# Patient Record
Sex: Female | Born: 1980 | Hispanic: No | Marital: Single | Smoking: Current some day smoker
Health system: Southern US, Community
[De-identification: ages and names within clinical notes are randomized; demographics above are authoritative.]

## PROBLEM LIST (undated history)

## (undated) DIAGNOSIS — F988 Other specified behavioral and emotional disorders with onset usually occurring in childhood and adolescence: Secondary | ICD-10-CM

## (undated) DIAGNOSIS — E785 Hyperlipidemia, unspecified: Secondary | ICD-10-CM

## (undated) DIAGNOSIS — E119 Type 2 diabetes mellitus without complications: Secondary | ICD-10-CM

## (undated) DIAGNOSIS — R51 Headache: Secondary | ICD-10-CM

## (undated) DIAGNOSIS — F419 Anxiety disorder, unspecified: Secondary | ICD-10-CM

## (undated) DIAGNOSIS — R519 Headache, unspecified: Secondary | ICD-10-CM

## (undated) DIAGNOSIS — I10 Essential (primary) hypertension: Secondary | ICD-10-CM

## (undated) HISTORY — DX: Headache, unspecified: R51.9

## (undated) HISTORY — DX: Headache: R51

## (undated) HISTORY — DX: Other specified behavioral and emotional disorders with onset usually occurring in childhood and adolescence: F98.8

## (undated) HISTORY — DX: Type 2 diabetes mellitus without complications: E11.9

## (undated) HISTORY — DX: Anxiety disorder, unspecified: F41.9

## (undated) HISTORY — DX: Essential (primary) hypertension: I10

## (undated) HISTORY — DX: Hyperlipidemia, unspecified: E78.5

---

## 2007-01-05 ENCOUNTER — Emergency Department (HOSPITAL_COMMUNITY): Admission: EM | Admit: 2007-01-05 | Discharge: 2007-01-06 | Payer: Self-pay | Admitting: Emergency Medicine

## 2009-07-20 ENCOUNTER — Ambulatory Visit: Payer: Self-pay | Admitting: Unknown Physician Specialty

## 2010-10-29 ENCOUNTER — Emergency Department (HOSPITAL_COMMUNITY): Payer: BC Managed Care – PPO

## 2010-10-29 ENCOUNTER — Emergency Department (HOSPITAL_COMMUNITY)
Admission: EM | Admit: 2010-10-29 | Discharge: 2010-10-29 | Disposition: A | Discharge status: Home / Self Care | Payer: BC Managed Care – PPO | Attending: Emergency Medicine | Admitting: Emergency Medicine

## 2010-10-29 DIAGNOSIS — R109 Unspecified abdominal pain: Secondary | ICD-10-CM | POA: Insufficient documentation

## 2010-10-29 DIAGNOSIS — E119 Type 2 diabetes mellitus without complications: Secondary | ICD-10-CM | POA: Insufficient documentation

## 2010-10-29 DIAGNOSIS — N12 Tubulo-interstitial nephritis, not specified as acute or chronic: Secondary | ICD-10-CM | POA: Insufficient documentation

## 2010-10-29 DIAGNOSIS — N72 Inflammatory disease of cervix uteri: Secondary | ICD-10-CM | POA: Insufficient documentation

## 2010-10-29 LAB — WET PREP, GENITAL
Trich, Wet Prep: NONE SEEN
Yeast Wet Prep HPF POC: NONE SEEN

## 2010-10-29 LAB — URINALYSIS, ROUTINE W REFLEX MICROSCOPIC
Bilirubin Urine: NEGATIVE
Protein, ur: NEGATIVE mg/dL
Urobilinogen, UA: 0.2 mg/dL (ref 0.0–1.0)

## 2010-10-29 LAB — DIFFERENTIAL
Basophils Absolute: 0 10*3/uL (ref 0.0–0.1)
Basophils Relative: 0 % (ref 0–1)
Monocytes Relative: 7 % (ref 3–12)
Neutro Abs: 8.3 10*3/uL — ABNORMAL HIGH (ref 1.7–7.7)
Neutrophils Relative %: 62 % (ref 43–77)

## 2010-10-29 LAB — COMPREHENSIVE METABOLIC PANEL
ALT: 15 U/L (ref 0–35)
Alkaline Phosphatase: 105 U/L (ref 39–117)
CO2: 27 mEq/L (ref 19–32)
Chloride: 99 mEq/L (ref 96–112)
GFR calc Af Amer: >60 mL/min (ref >60)
GFR calc non Af Amer: >60 mL/min (ref >60)
Glucose, Bld: 86 mg/dL (ref 70–99)
Potassium: 4.1 mEq/L (ref 3.5–5.1)
Sodium: 136 mEq/L (ref 135–145)
Total Bilirubin: 0.4 mg/dL (ref 0.3–1.2)

## 2010-10-29 LAB — CBC
Hemoglobin: 11.6 g/dL — ABNORMAL LOW (ref 12.0–15.0)
MCH: 24.5 pg — ABNORMAL LOW (ref 26.0–34.0)
RBC: 4.73 MIL/uL (ref 3.87–5.11)

## 2010-10-29 MED ORDER — IOHEXOL 300 MG/ML  SOLN
100.0000 mL | Freq: Once | INTRAMUSCULAR | Status: AC | PRN
  Administered 2010-10-29: 100 mL via INTRAVENOUS

## 2010-10-30 LAB — GC/CHLAMYDIA PROBE AMP, GENITAL
Chlamydia, DNA Probe: POSITIVE — AB
GC Probe Amp, Genital: NEGATIVE

## 2010-10-31 LAB — URINE CULTURE: Colony Count: NO GROWTH

## 2011-02-15 LAB — RAPID STREP SCREEN (MED CTR MEBANE ONLY): Streptococcus, Group A Screen (Direct): NEGATIVE

## 2015-09-05 ENCOUNTER — Emergency Department (HOSPITAL_BASED_OUTPATIENT_CLINIC_OR_DEPARTMENT_OTHER)
Admission: EM | Admit: 2015-09-05 | Discharge: 2015-09-05 | Disposition: A | Discharge status: Home / Self Care | Payer: No Typology Code available for payment source | Attending: Emergency Medicine | Admitting: Emergency Medicine

## 2015-09-05 ENCOUNTER — Encounter (HOSPITAL_BASED_OUTPATIENT_CLINIC_OR_DEPARTMENT_OTHER): Payer: Self-pay

## 2015-09-05 DIAGNOSIS — S060X0A Concussion without loss of consciousness, initial encounter: Secondary | ICD-10-CM | POA: Insufficient documentation

## 2015-09-05 DIAGNOSIS — Y939 Activity, unspecified: Secondary | ICD-10-CM | POA: Insufficient documentation

## 2015-09-05 DIAGNOSIS — R112 Nausea with vomiting, unspecified: Secondary | ICD-10-CM | POA: Insufficient documentation

## 2015-09-05 DIAGNOSIS — Y999 Unspecified external cause status: Secondary | ICD-10-CM | POA: Diagnosis not present

## 2015-09-05 DIAGNOSIS — Y929 Unspecified place or not applicable: Secondary | ICD-10-CM | POA: Insufficient documentation

## 2015-09-05 DIAGNOSIS — S199XXA Unspecified injury of neck, initial encounter: Secondary | ICD-10-CM | POA: Diagnosis present

## 2015-09-05 DIAGNOSIS — F17200 Nicotine dependence, unspecified, uncomplicated: Secondary | ICD-10-CM | POA: Insufficient documentation

## 2015-09-05 MED ORDER — ONDANSETRON 4 MG PO TBDP: 4.0000 mg | ORAL_TABLET | Freq: Three times a day (TID) | ORAL | Status: DC | PRN

## 2015-09-05 MED ORDER — NAPROXEN 500 MG PO TABS: 500.0000 mg | ORAL_TABLET | Freq: Two times a day (BID) | ORAL | Status: DC

## 2015-09-05 MED ORDER — METHOCARBAMOL 500 MG PO TABS: 500.0000 mg | ORAL_TABLET | Freq: Two times a day (BID) | ORAL | Status: DC

## 2015-09-05 NOTE — ED Notes (Signed)
PA at bedside.
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
## 2015-11-14 ENCOUNTER — Institutional Professional Consult (permissible substitution): Payer: Self-pay | Admitting: Neurology

## 2015-12-05 ENCOUNTER — Institutional Professional Consult (permissible substitution): Payer: Self-pay | Admitting: Neurology

## 2015-12-05 ENCOUNTER — Telehealth: Payer: Self-pay

## 2015-12-05 NOTE — Telephone Encounter (Signed)
Pt did not show for their appt with Dr. Dohmeier today.  

## 2015-12-06 ENCOUNTER — Encounter: Payer: Self-pay | Admitting: Neurology

## 2016-01-01 ENCOUNTER — Encounter: Payer: Self-pay | Admitting: Neurology

## 2016-01-01 ENCOUNTER — Ambulatory Visit (INDEPENDENT_AMBULATORY_CARE_PROVIDER_SITE_OTHER): Payer: BLUE CROSS/BLUE SHIELD | Admitting: Neurology

## 2016-01-01 VITALS — BP 140/88 | HR 92 | Resp 20 | Ht 62.0 in | Wt 209.0 lb

## 2016-01-01 DIAGNOSIS — R0683 Snoring: Secondary | ICD-10-CM | POA: Diagnosis not present

## 2016-01-01 DIAGNOSIS — G4710 Hypersomnia, unspecified: Secondary | ICD-10-CM

## 2016-01-01 DIAGNOSIS — G4730 Sleep apnea, unspecified: Secondary | ICD-10-CM

## 2016-01-01 MED ORDER — AMPHETAMINE-DEXTROAMPHET ER 15 MG PO CP24: 15.0000 mg | ORAL_CAPSULE | ORAL | 0 refills | Status: DC
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
## 2016-01-04 ENCOUNTER — Institutional Professional Consult (permissible substitution): Payer: BLUE CROSS/BLUE SHIELD | Admitting: Neurology

## 2016-04-02 ENCOUNTER — Other Ambulatory Visit: Payer: Self-pay | Admitting: Neurology

## 2016-04-02 MED ORDER — AMPHETAMINE-DEXTROAMPHET ER 15 MG PO CP24: 15.0000 mg | ORAL_CAPSULE | ORAL | 0 refills | Status: DC

## 2016-04-02 NOTE — Telephone Encounter (Signed)
I spoke to pt and advised her that her RX for adderall is ready for pick up at the front desk. I gave clinic hours sand advised her to bring a photo ID. Pt verbalized understanding.

## 2016-04-02 NOTE — Telephone Encounter (Signed)
Patient requesting refill of  amphetamine-dextroamphetamine (ADDERALL XR) 15 MG 24 hr capsule  I advised the Rx will be ready in 24 hours unless the nurse advises otherwise. ° ° °

## 2016-04-02 NOTE — Addendum Note (Signed)
Addended by: Geronimo RunningINKINS, Cleopha Indelicato A on: 04/02/2016 01:44 PM   Modules accepted: Orders

## 2016-06-28 ENCOUNTER — Other Ambulatory Visit: Payer: Self-pay | Admitting: Neurology

## 2016-06-28 MED ORDER — AMPHETAMINE-DEXTROAMPHET ER 15 MG PO CP24: 15.0000 mg | ORAL_CAPSULE | ORAL | 0 refills | Status: DC

## 2016-06-28 NOTE — Telephone Encounter (Signed)
Spoke to pt and relayed that prescription ready for pick up.  She verbalized udnerstanding that this will be next week.  Placed up front.

## 2016-06-28 NOTE — Telephone Encounter (Signed)
Pt request refill for amphetamine-dextroamphetamine (ADDERALL XR) 15 MG 24 hr capsule .says she was told she could get 90 day supply

## 2016-08-09 ENCOUNTER — Other Ambulatory Visit: Payer: Self-pay | Admitting: Neurology

## 2016-08-09 NOTE — Telephone Encounter (Signed)
Pt's last OV w/ Dr. Vickey Huger was 01/01/16 and she does have 1 yr f/u scheduled. However, 90 day Adderall rx was printed 06/28/16. Next 90 day rx not due until May.

## 2016-08-09 NOTE — Telephone Encounter (Signed)
Pt request refill for amphetamine-dextroamphetamine (ADDERALL XR) 15 MG 24 hr capsule, she asked if it could be for 3 month supply. She will pick it up on Tuesday

## 2016-08-12 ENCOUNTER — Telehealth: Payer: Self-pay

## 2016-08-12 MED ORDER — AMPHETAMINE-DEXTROAMPHET ER 15 MG PO CP24: 15.0000 mg | ORAL_CAPSULE | ORAL | 0 refills | Status: DC

## 2016-08-12 NOTE — Telephone Encounter (Signed)
I spoke to patient and she states that she only got a 30 day Rx. I called the [facility] on [address] and confirmed that patient received a 30 day prescription on 2/28. Patient has f/u appt scheduled. I will print new Rx, once signed it will be placed at the front desk for pick up. Patient is aware that it will be able to p/u tomorrow.

## 2016-08-12 NOTE — Telephone Encounter (Signed)
Patient unable to afford sleep study

## 2016-08-12 NOTE — Addendum Note (Signed)
Addended by: Crisoforo Oxford on: 08/12/2016 11:39 AM   Modules accepted: Orders

## 2016-09-24 ENCOUNTER — Other Ambulatory Visit: Payer: Self-pay | Admitting: Internal Medicine

## 2016-09-24 DIAGNOSIS — R51 Headache: Principal | ICD-10-CM

## 2016-09-24 DIAGNOSIS — R519 Headache, unspecified: Secondary | ICD-10-CM

## 2016-10-02 ENCOUNTER — Ambulatory Visit
Admission: RE | Admit: 2016-10-02 | Discharge: 2016-10-02 | Disposition: A | Discharge status: Home / Self Care | Payer: BLUE CROSS/BLUE SHIELD | Source: Ambulatory Visit | Attending: Internal Medicine | Admitting: Internal Medicine

## 2016-10-02 DIAGNOSIS — R519 Headache, unspecified: Secondary | ICD-10-CM

## 2016-10-02 DIAGNOSIS — R51 Headache: Principal | ICD-10-CM

## 2017-01-01 ENCOUNTER — Ambulatory Visit (INDEPENDENT_AMBULATORY_CARE_PROVIDER_SITE_OTHER): Payer: BLUE CROSS/BLUE SHIELD | Admitting: Neurology

## 2017-01-01 ENCOUNTER — Encounter: Payer: Self-pay | Admitting: Neurology

## 2017-01-01 ENCOUNTER — Ambulatory Visit: Payer: BLUE CROSS/BLUE SHIELD | Admitting: Neurology

## 2017-01-01 VITALS — BP 125/81 | HR 92 | Ht 63.0 in | Wt 201.0 lb

## 2017-01-01 DIAGNOSIS — G4719 Other hypersomnia: Secondary | ICD-10-CM

## 2017-01-01 DIAGNOSIS — Z6835 Body mass index (BMI) 35.0-35.9, adult: Secondary | ICD-10-CM | POA: Diagnosis not present

## 2017-01-01 DIAGNOSIS — R351 Nocturia: Secondary | ICD-10-CM | POA: Diagnosis not present

## 2017-01-01 DIAGNOSIS — R0683 Snoring: Secondary | ICD-10-CM | POA: Insufficient documentation

## 2017-01-01 MED ORDER — AMPHETAMINE-DEXTROAMPHET ER 15 MG PO CP24: 15.0000 mg | ORAL_CAPSULE | ORAL | 0 refills | Status: DC
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
## 2017-01-29 ENCOUNTER — Ambulatory Visit (INDEPENDENT_AMBULATORY_CARE_PROVIDER_SITE_OTHER): Payer: BLUE CROSS/BLUE SHIELD | Admitting: Neurology

## 2017-01-29 DIAGNOSIS — G4733 Obstructive sleep apnea (adult) (pediatric): Secondary | ICD-10-CM

## 2017-01-29 DIAGNOSIS — R0683 Snoring: Secondary | ICD-10-CM

## 2017-01-29 DIAGNOSIS — R351 Nocturia: Secondary | ICD-10-CM

## 2017-01-31 NOTE — Procedures (Signed)
[facility]  Neurologic Associate 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  She snores, her friend told her she stopped breathing in her sleep. She is sleep deprived with a late bedtime and very early rise time.  Epworth score 9, on medication and 16 point without; Fatigue severity score now 48. STUDY RESULTS: Total Recording:     6 hours 49 minutes,  Total Apnea/Hypopnea Index (AHI): 14.3/ hour RDI: 17 Average Oxygen Saturation: SpO2 94%; Lowest Oxygen Saturation:  77%  Time Oxygen Saturation Below 88%:    6 min =2 % Average Heart Rate:   82 bpm (63- 225) IMPRESSION:  Mild OSA, no prolonged oxygen desaturation associated.  Tachycardia was noted, could be artefact ( depending on clinical symptoms of palpitation being present) .   RECOMMENDATION: CPAP or dental device can be used to treat this mild form of OSA.  I certify that I have reviewed the raw data recording prior to the issuance of this report in accordance with the standards of the American Academy of Sleep Medicine (AASM). Melvyn Novas, MD   01-31-2017 Medical Director of [facility] Diplomat of the ABPN and ABSM Member of and accredited by AASM

## 2017-02-04 ENCOUNTER — Telehealth: Payer: Self-pay | Admitting: Neurology

## 2017-02-04 NOTE — Telephone Encounter (Signed)
Called with sleep study results. No answer. LVM for pt to call back

## 2017-02-04 NOTE — Telephone Encounter (Signed)
-----   Message from Linda Novas, MD sent at 01/31/2017  1:50 PM EDT ----- If patient agrees, will order auto titration CPAP 5-12 cm water with 2 cm EPR and fit hr for mask of her choice and comfort. Baird Lyons,  Please let me know ,CD

## 2017-02-04 NOTE — Telephone Encounter (Signed)
Spoke with the patient and made her aware that Dr Vickey Huger did see mild OSA. She is recommended CPAP or dental device. Pt would like to proceed forward with dental device. Pt states that she will found out if her dentist makes those and she will contact us back and we can send referral at that point

## 2017-02-25 DIAGNOSIS — E7439 Other disorders of intestinal carbohydrate absorption: Secondary | ICD-10-CM | POA: Diagnosis not present

## 2017-02-25 DIAGNOSIS — E785 Hyperlipidemia, unspecified: Secondary | ICD-10-CM | POA: Diagnosis not present

## 2017-03-12 DIAGNOSIS — Z113 Encounter for screening for infections with a predominantly sexual mode of transmission: Secondary | ICD-10-CM | POA: Diagnosis not present

## 2017-03-12 DIAGNOSIS — Z6835 Body mass index (BMI) 35.0-35.9, adult: Secondary | ICD-10-CM | POA: Diagnosis not present

## 2017-03-12 DIAGNOSIS — N939 Abnormal uterine and vaginal bleeding, unspecified: Secondary | ICD-10-CM | POA: Diagnosis not present

## 2017-03-17 DIAGNOSIS — E119 Type 2 diabetes mellitus without complications: Secondary | ICD-10-CM | POA: Diagnosis not present

## 2017-03-17 DIAGNOSIS — F988 Other specified behavioral and emotional disorders with onset usually occurring in childhood and adolescence: Secondary | ICD-10-CM | POA: Diagnosis not present

## 2017-06-17 DIAGNOSIS — F988 Other specified behavioral and emotional disorders with onset usually occurring in childhood and adolescence: Secondary | ICD-10-CM | POA: Diagnosis not present

## 2017-06-17 DIAGNOSIS — R0981 Nasal congestion: Secondary | ICD-10-CM | POA: Diagnosis not present

## 2017-06-17 DIAGNOSIS — E119 Type 2 diabetes mellitus without complications: Secondary | ICD-10-CM | POA: Diagnosis not present

## 2017-08-14 DIAGNOSIS — Z01419 Encounter for gynecological examination (general) (routine) without abnormal findings: Secondary | ICD-10-CM | POA: Diagnosis not present

## 2017-08-28 DIAGNOSIS — E119 Type 2 diabetes mellitus without complications: Secondary | ICD-10-CM | POA: Diagnosis not present

## 2017-08-28 DIAGNOSIS — T7840XA Allergy, unspecified, initial encounter: Secondary | ICD-10-CM | POA: Diagnosis not present

## 2017-08-28 DIAGNOSIS — Z6830 Body mass index (BMI) 30.0-30.9, adult: Secondary | ICD-10-CM | POA: Diagnosis not present

## 2017-08-28 DIAGNOSIS — E785 Hyperlipidemia, unspecified: Secondary | ICD-10-CM | POA: Diagnosis not present

## 2017-09-25 DIAGNOSIS — Z6830 Body mass index (BMI) 30.0-30.9, adult: Secondary | ICD-10-CM | POA: Diagnosis not present

## 2017-09-25 DIAGNOSIS — J0100 Acute maxillary sinusitis, unspecified: Secondary | ICD-10-CM | POA: Diagnosis not present

## 2017-09-25 DIAGNOSIS — G43909 Migraine, unspecified, not intractable, without status migrainosus: Secondary | ICD-10-CM | POA: Diagnosis not present

## 2017-10-22 DIAGNOSIS — E119 Type 2 diabetes mellitus without complications: Secondary | ICD-10-CM | POA: Diagnosis not present

## 2017-10-22 DIAGNOSIS — E785 Hyperlipidemia, unspecified: Secondary | ICD-10-CM | POA: Diagnosis not present

## 2017-12-09 DIAGNOSIS — Z113 Encounter for screening for infections with a predominantly sexual mode of transmission: Secondary | ICD-10-CM | POA: Diagnosis not present

## 2017-12-09 DIAGNOSIS — G43909 Migraine, unspecified, not intractable, without status migrainosus: Secondary | ICD-10-CM | POA: Diagnosis not present

## 2017-12-09 DIAGNOSIS — Z0001 Encounter for general adult medical examination with abnormal findings: Secondary | ICD-10-CM | POA: Diagnosis not present

## 2017-12-09 DIAGNOSIS — Z0000 Encounter for general adult medical examination without abnormal findings: Secondary | ICD-10-CM | POA: Diagnosis not present

## 2017-12-09 DIAGNOSIS — L239 Allergic contact dermatitis, unspecified cause: Secondary | ICD-10-CM | POA: Diagnosis not present

## 2017-12-09 DIAGNOSIS — E119 Type 2 diabetes mellitus without complications: Secondary | ICD-10-CM | POA: Diagnosis not present

## 2017-12-09 DIAGNOSIS — Z6830 Body mass index (BMI) 30.0-30.9, adult: Secondary | ICD-10-CM | POA: Diagnosis not present

## 2018-01-06 ENCOUNTER — Ambulatory Visit: Payer: BLUE CROSS/BLUE SHIELD | Admitting: Adult Health

## 2018-01-07 ENCOUNTER — Ambulatory Visit: Payer: BLUE CROSS/BLUE SHIELD | Admitting: Adult Health

## 2018-01-20 DIAGNOSIS — M79672 Pain in left foot: Secondary | ICD-10-CM | POA: Diagnosis not present

## 2018-01-20 DIAGNOSIS — M79673 Pain in unspecified foot: Secondary | ICD-10-CM | POA: Diagnosis not present

## 2018-01-20 DIAGNOSIS — S92302A Fracture of unspecified metatarsal bone(s), left foot, initial encounter for closed fracture: Secondary | ICD-10-CM | POA: Diagnosis not present

## 2018-01-20 DIAGNOSIS — E119 Type 2 diabetes mellitus without complications: Secondary | ICD-10-CM | POA: Diagnosis not present

## 2018-01-22 DIAGNOSIS — M79675 Pain in left toe(s): Secondary | ICD-10-CM | POA: Diagnosis not present

## 2018-01-24 ENCOUNTER — Ambulatory Visit: Payer: BLUE CROSS/BLUE SHIELD | Admitting: Sports Medicine

## 2018-05-12 DIAGNOSIS — G43909 Migraine, unspecified, not intractable, without status migrainosus: Secondary | ICD-10-CM | POA: Diagnosis not present

## 2018-05-21 ENCOUNTER — Other Ambulatory Visit: Payer: Self-pay | Admitting: Internal Medicine

## 2018-05-21 DIAGNOSIS — R519 Headache, unspecified: Secondary | ICD-10-CM

## 2018-05-21 DIAGNOSIS — R51 Headache: Principal | ICD-10-CM

## 2018-05-23 ENCOUNTER — Other Ambulatory Visit: Payer: BLUE CROSS/BLUE SHIELD

## 2018-07-27 IMAGING — CT CT HEAD W/O CM
3 series · 15 of 47 positions shown, 18 images · non-contrast
Comparison: MRI of the brain 07/20/2009

CLINICAL DATA: Non intractable headache, unspecified chronicity
pattern, unspecified headache type.

EXAM:
CT HEAD WITHOUT CONTRAST
TECHNIQUE: Contiguous axial images were obtained from the base of the skull
through the vertex without intravenous contrast.

[Series 2: head w/(date) · axial · 0.42mm/px · z∈[+897,+1022]mm · 9 of 30 slices shown, 12 images]
[im 3/30  brain]
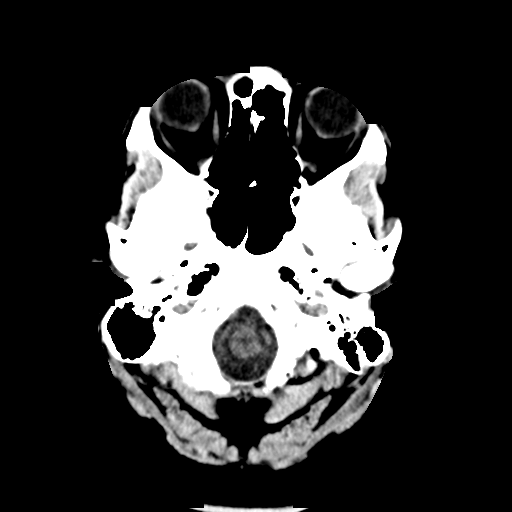
[im 3/30  bone]
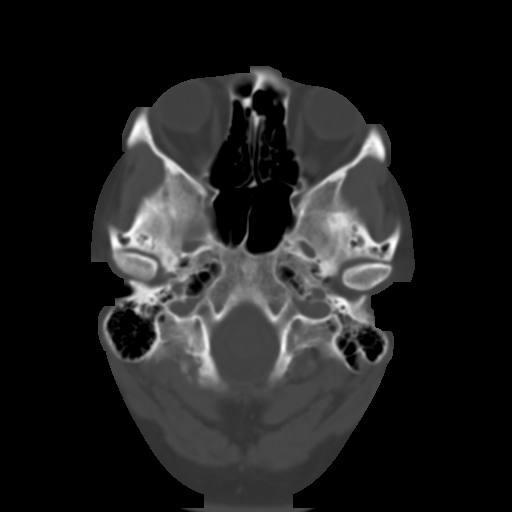
[im 6/30  brain]
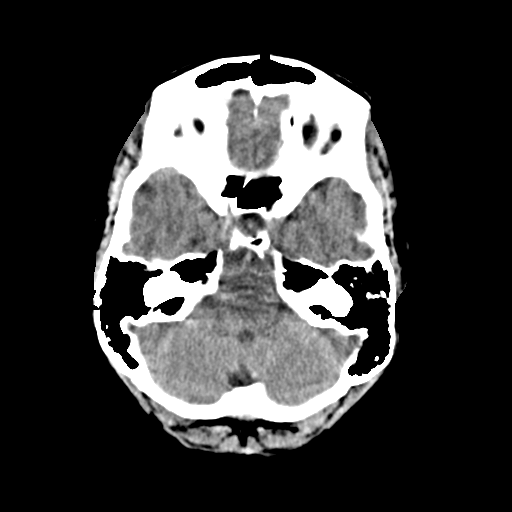
[im 9/30  brain]
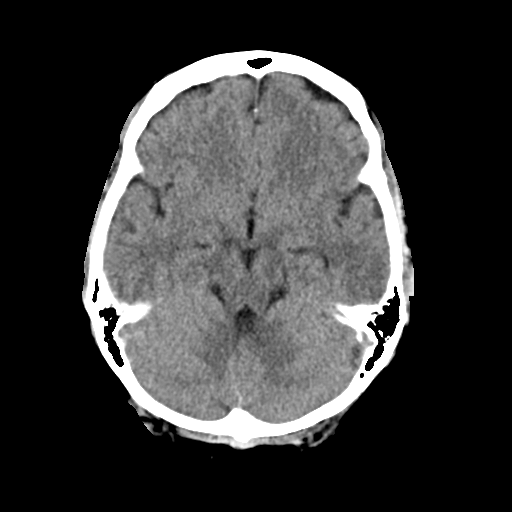
[im 12/30  brain]
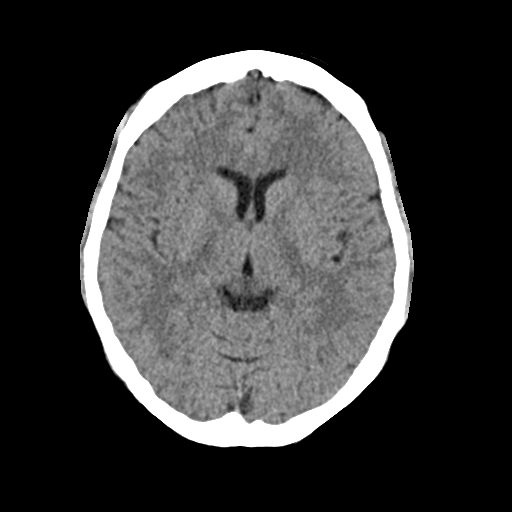
[im 16/30  brain]
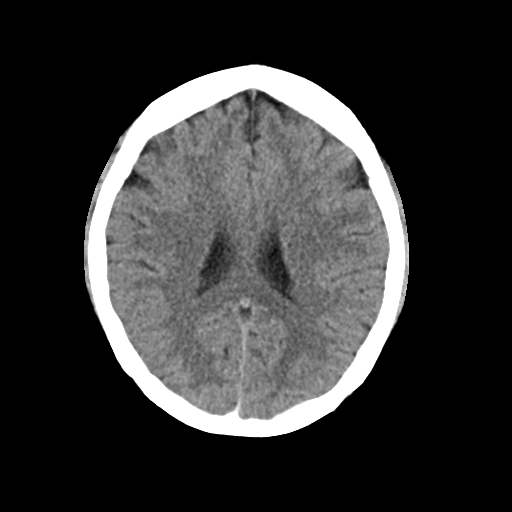
[im 16/30  bone]
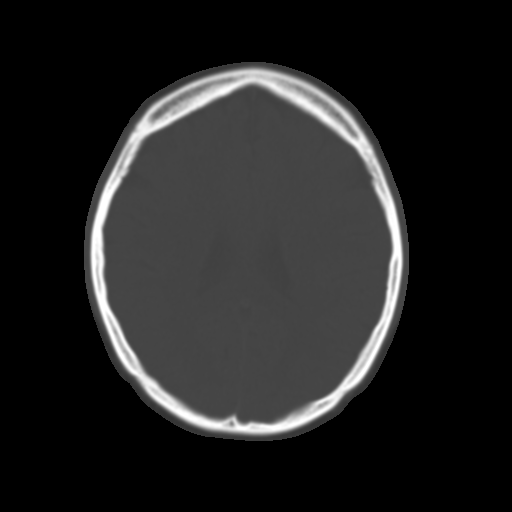
[im 19/30  brain]
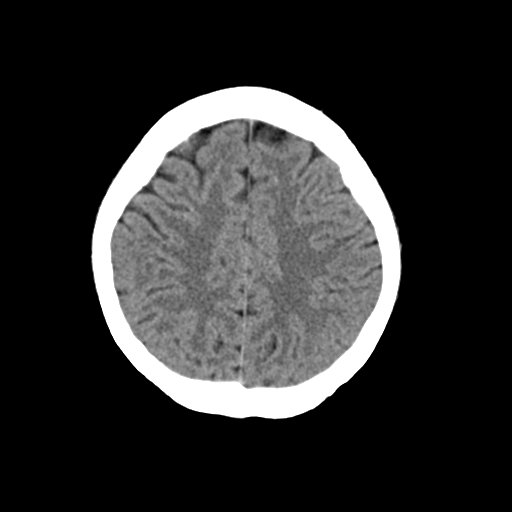
[im 22/30  brain]
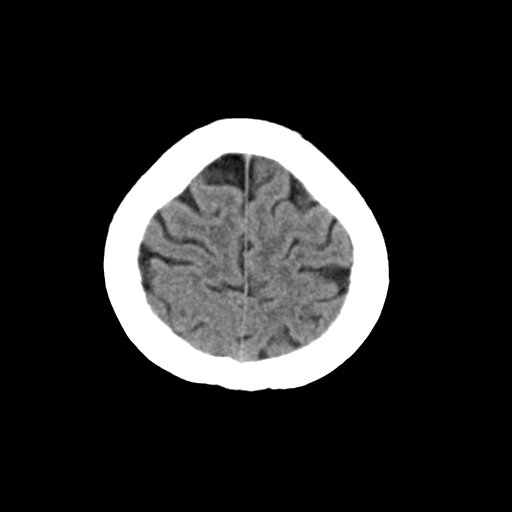
[im 25/30  brain]
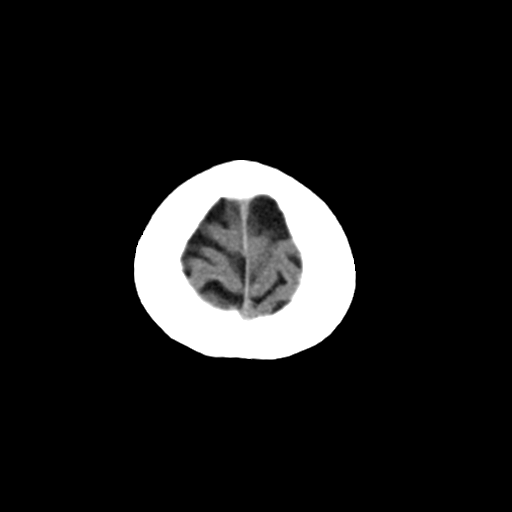
[im 28/30  brain]
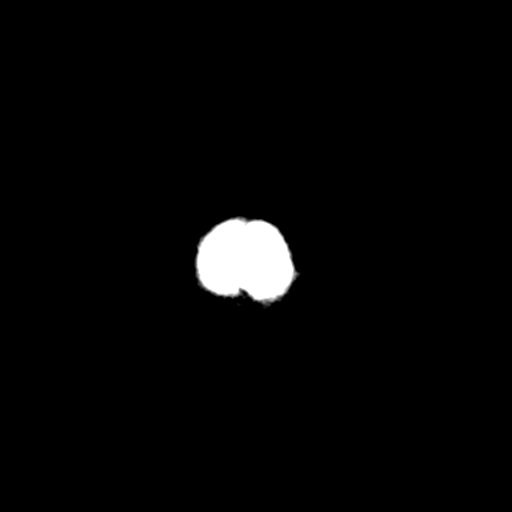
[im 28/30  bone]
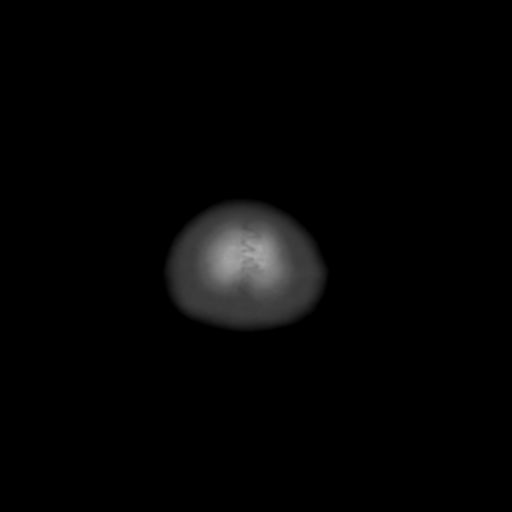

[Series 4: cor · coronal · 0.28mm/px · 3 of 59 slices shown]
[im 20/59  brain]
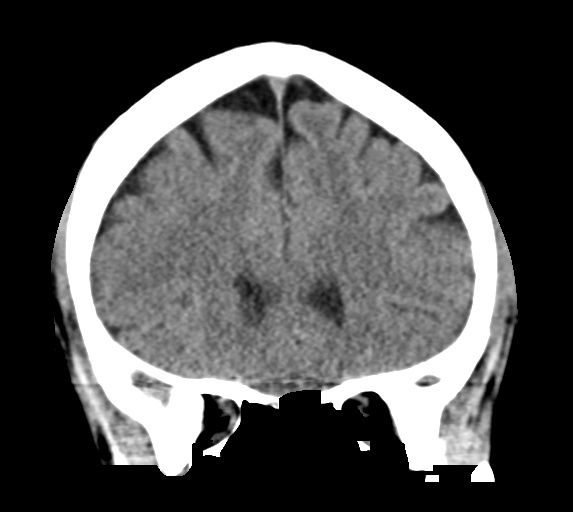
[im 26/59  brain]
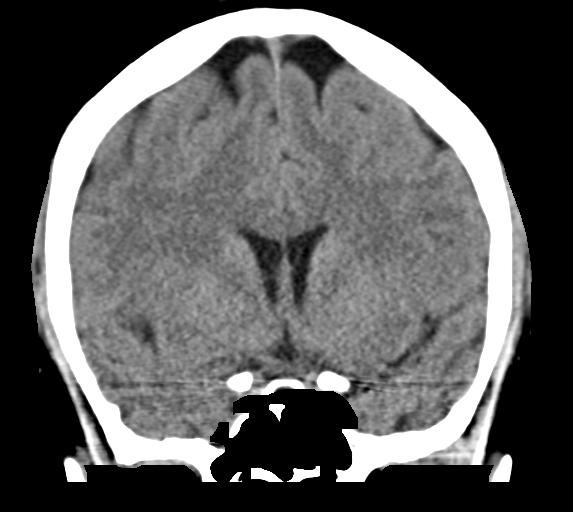
[im 33/59  brain]
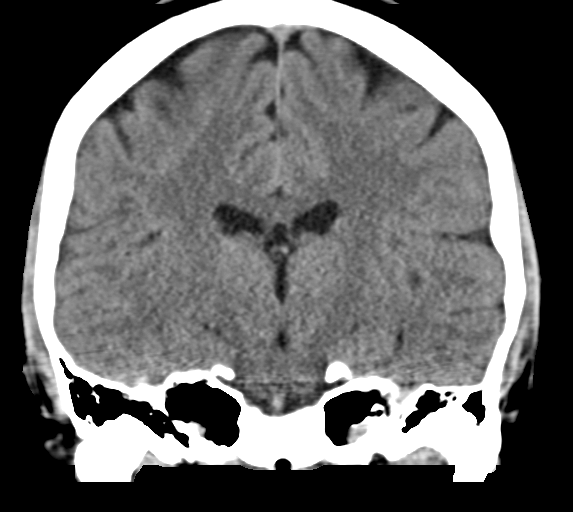

[Series 5: sag · sagittal · 0.27mm/px · 3 of 51 slices shown]
[im 17/51  brain]
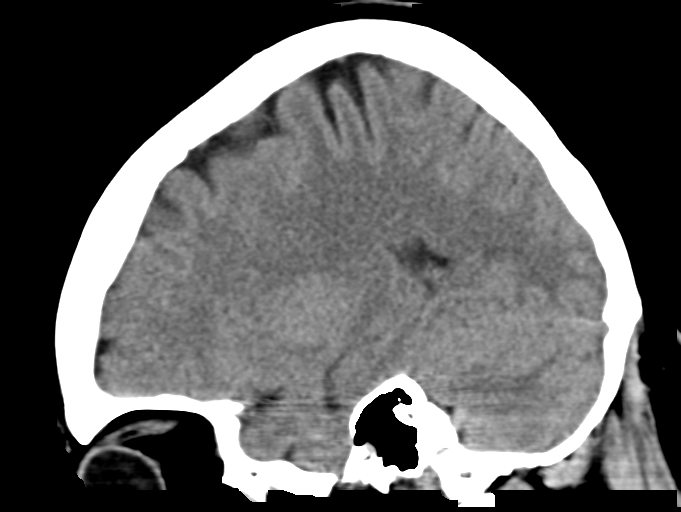
[im 26/51  brain]
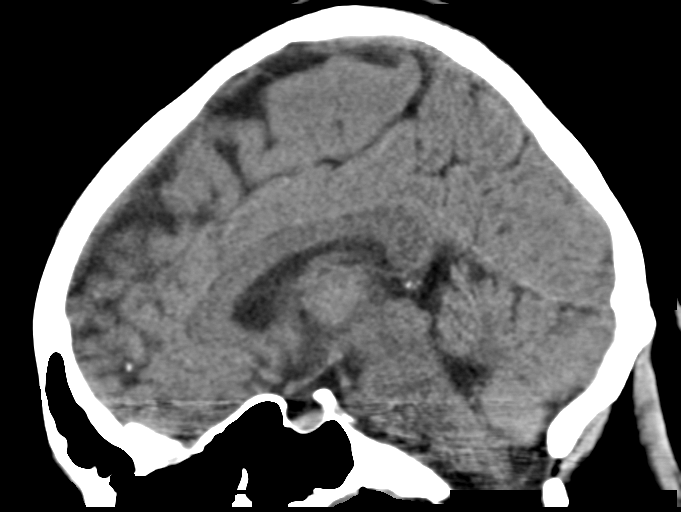
[im 34/51  brain]
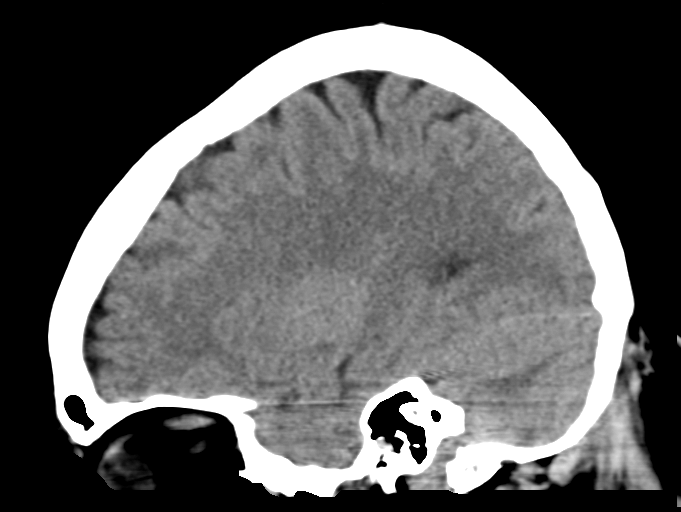

[15 of 47 positions shown; findings below may reference images not displayed]

FINDINGS: Brain: No acute infarct, hemorrhage, or mass lesion is present. No
significant white matter disease is present. The brainstem and
cerebellum are normal.

Vascular: No hyperdense vessel or unexpected calcification.

Skull: The calvarium is intact. No focal lytic or blastic lesions
are present.

Sinuses/Orbits: Paranasal sinuses and mastoid air cells are clear.
The globes and orbits are within normal limits.
IMPRESSION: Negative CT of the head.

## 2018-08-20 DIAGNOSIS — E785 Hyperlipidemia, unspecified: Secondary | ICD-10-CM | POA: Diagnosis not present

## 2018-08-20 DIAGNOSIS — I10 Essential (primary) hypertension: Secondary | ICD-10-CM | POA: Diagnosis not present

## 2018-08-20 DIAGNOSIS — Z79899 Other long term (current) drug therapy: Secondary | ICD-10-CM | POA: Diagnosis not present

## 2018-08-20 DIAGNOSIS — E119 Type 2 diabetes mellitus without complications: Secondary | ICD-10-CM | POA: Diagnosis not present

## 2018-08-24 DIAGNOSIS — Z6830 Body mass index (BMI) 30.0-30.9, adult: Secondary | ICD-10-CM | POA: Diagnosis not present

## 2018-08-24 DIAGNOSIS — F988 Other specified behavioral and emotional disorders with onset usually occurring in childhood and adolescence: Secondary | ICD-10-CM | POA: Diagnosis not present

## 2018-08-24 DIAGNOSIS — E119 Type 2 diabetes mellitus without complications: Secondary | ICD-10-CM | POA: Diagnosis not present

## 2018-08-25 ENCOUNTER — Telehealth: Payer: Self-pay | Admitting: Oncology

## 2018-08-25 NOTE — Telephone Encounter (Signed)
A new patient appt as been scheduled for the pt to see Dr. Clelia Croft on 5/7 at 11am. Pt aware to arrive 15 minutes early.

## 2018-09-10 ENCOUNTER — Encounter: Payer: BLUE CROSS/BLUE SHIELD | Admitting: Oncology

## 2018-12-24 DIAGNOSIS — Z20828 Contact with and (suspected) exposure to other viral communicable diseases: Secondary | ICD-10-CM | POA: Diagnosis not present

## 2019-02-18 DIAGNOSIS — Z20828 Contact with and (suspected) exposure to other viral communicable diseases: Secondary | ICD-10-CM | POA: Diagnosis not present

## 2019-04-08 DIAGNOSIS — R739 Hyperglycemia, unspecified: Secondary | ICD-10-CM | POA: Diagnosis not present

## 2019-04-08 DIAGNOSIS — E7800 Pure hypercholesterolemia, unspecified: Secondary | ICD-10-CM | POA: Diagnosis not present

## 2019-04-08 DIAGNOSIS — I10 Essential (primary) hypertension: Secondary | ICD-10-CM | POA: Diagnosis not present

## 2019-04-12 DIAGNOSIS — F988 Other specified behavioral and emotional disorders with onset usually occurring in childhood and adolescence: Secondary | ICD-10-CM | POA: Diagnosis not present

## 2019-04-12 DIAGNOSIS — G43909 Migraine, unspecified, not intractable, without status migrainosus: Secondary | ICD-10-CM | POA: Diagnosis not present

## 2019-04-12 DIAGNOSIS — E7800 Pure hypercholesterolemia, unspecified: Secondary | ICD-10-CM | POA: Diagnosis not present

## 2019-04-12 DIAGNOSIS — E119 Type 2 diabetes mellitus without complications: Secondary | ICD-10-CM | POA: Diagnosis not present

## 2019-05-03 DIAGNOSIS — Z20828 Contact with and (suspected) exposure to other viral communicable diseases: Secondary | ICD-10-CM | POA: Diagnosis not present

## 2019-05-03 DIAGNOSIS — Z03818 Encounter for observation for suspected exposure to other biological agents ruled out: Secondary | ICD-10-CM | POA: Diagnosis not present

## 2019-06-03 DIAGNOSIS — Z20828 Contact with and (suspected) exposure to other viral communicable diseases: Secondary | ICD-10-CM | POA: Diagnosis not present

## 2019-06-14 DIAGNOSIS — Z20828 Contact with and (suspected) exposure to other viral communicable diseases: Secondary | ICD-10-CM | POA: Diagnosis not present

## 2019-06-16 ENCOUNTER — Telehealth: Payer: Self-pay | Admitting: Internal Medicine

## 2019-06-16 DIAGNOSIS — R0600 Dyspnea, unspecified: Secondary | ICD-10-CM

## 2019-06-20 DIAGNOSIS — Z20828 Contact with and (suspected) exposure to other viral communicable diseases: Secondary | ICD-10-CM | POA: Diagnosis not present

## 2019-06-20 DIAGNOSIS — Z03818 Encounter for observation for suspected exposure to other biological agents ruled out: Secondary | ICD-10-CM | POA: Diagnosis not present

## 2019-07-09 DIAGNOSIS — E119 Type 2 diabetes mellitus without complications: Secondary | ICD-10-CM | POA: Diagnosis not present

## 2019-07-09 DIAGNOSIS — E7800 Pure hypercholesterolemia, unspecified: Secondary | ICD-10-CM | POA: Diagnosis not present

## 2019-07-20 DIAGNOSIS — G43909 Migraine, unspecified, not intractable, without status migrainosus: Secondary | ICD-10-CM | POA: Diagnosis not present

## 2019-07-20 DIAGNOSIS — Z8616 Personal history of COVID-19: Secondary | ICD-10-CM | POA: Diagnosis not present

## 2019-07-20 DIAGNOSIS — E119 Type 2 diabetes mellitus without complications: Secondary | ICD-10-CM | POA: Diagnosis not present

## 2019-07-20 DIAGNOSIS — U071 COVID-19: Secondary | ICD-10-CM | POA: Diagnosis not present

## 2019-07-20 DIAGNOSIS — F988 Other specified behavioral and emotional disorders with onset usually occurring in childhood and adolescence: Secondary | ICD-10-CM | POA: Diagnosis not present

## 2019-07-20 DIAGNOSIS — E785 Hyperlipidemia, unspecified: Secondary | ICD-10-CM | POA: Diagnosis not present

## 2019-08-11 DIAGNOSIS — E119 Type 2 diabetes mellitus without complications: Secondary | ICD-10-CM | POA: Diagnosis not present

## 2019-08-11 DIAGNOSIS — E7800 Pure hypercholesterolemia, unspecified: Secondary | ICD-10-CM | POA: Diagnosis not present

## 2019-08-13 DIAGNOSIS — Z113 Encounter for screening for infections with a predominantly sexual mode of transmission: Secondary | ICD-10-CM | POA: Diagnosis not present

## 2019-08-13 DIAGNOSIS — Z01419 Encounter for gynecological examination (general) (routine) without abnormal findings: Secondary | ICD-10-CM | POA: Diagnosis not present

## 2019-08-13 DIAGNOSIS — Z1231 Encounter for screening mammogram for malignant neoplasm of breast: Secondary | ICD-10-CM | POA: Diagnosis not present

## 2019-08-13 DIAGNOSIS — Z6836 Body mass index (BMI) 36.0-36.9, adult: Secondary | ICD-10-CM | POA: Diagnosis not present

## 2019-08-18 DIAGNOSIS — Z0000 Encounter for general adult medical examination without abnormal findings: Secondary | ICD-10-CM | POA: Diagnosis not present

## 2019-10-18 DIAGNOSIS — N760 Acute vaginitis: Secondary | ICD-10-CM | POA: Diagnosis not present

## 2019-10-18 DIAGNOSIS — Z113 Encounter for screening for infections with a predominantly sexual mode of transmission: Secondary | ICD-10-CM | POA: Diagnosis not present

## 2019-10-18 DIAGNOSIS — Z6836 Body mass index (BMI) 36.0-36.9, adult: Secondary | ICD-10-CM | POA: Diagnosis not present

## 2020-04-11 ENCOUNTER — Other Ambulatory Visit: Payer: Self-pay | Admitting: Internal Medicine

## 2020-04-11 ENCOUNTER — Ambulatory Visit
Admission: RE | Admit: 2020-04-11 | Discharge: 2020-04-11 | Disposition: A | Discharge status: Home / Self Care | Payer: BLUE CROSS/BLUE SHIELD | Source: Ambulatory Visit | Attending: Internal Medicine | Admitting: Internal Medicine

## 2020-04-11 DIAGNOSIS — R1031 Right lower quadrant pain: Secondary | ICD-10-CM

## 2020-04-11 MED ORDER — IOPAMIDOL (ISOVUE-300) INJECTION 61%
100.0000 mL | Freq: Once | INTRAVENOUS | Status: AC | PRN
  Administered 2020-04-11: 100 mL via INTRAVENOUS

## 2020-04-15 ENCOUNTER — Other Ambulatory Visit: Payer: Self-pay

## 2020-04-15 ENCOUNTER — Telehealth (HOSPITAL_COMMUNITY): Payer: Self-pay | Admitting: Student

## 2020-04-15 ENCOUNTER — Encounter (HOSPITAL_COMMUNITY): Payer: Self-pay

## 2020-04-15 ENCOUNTER — Emergency Department (HOSPITAL_COMMUNITY)
Admission: EM | Admit: 2020-04-15 | Discharge: 2020-04-15 | Disposition: A | Discharge status: Home / Self Care | Payer: BC Managed Care – PPO | Attending: Emergency Medicine | Admitting: Emergency Medicine

## 2020-04-15 DIAGNOSIS — M545 Low back pain, unspecified: Secondary | ICD-10-CM | POA: Insufficient documentation

## 2020-04-15 DIAGNOSIS — Z91040 Latex allergy status: Secondary | ICD-10-CM | POA: Diagnosis not present

## 2020-04-15 DIAGNOSIS — F17200 Nicotine dependence, unspecified, uncomplicated: Secondary | ICD-10-CM | POA: Diagnosis not present

## 2020-04-15 DIAGNOSIS — R1031 Right lower quadrant pain: Secondary | ICD-10-CM | POA: Diagnosis not present

## 2020-04-15 DIAGNOSIS — I10 Essential (primary) hypertension: Secondary | ICD-10-CM | POA: Diagnosis not present

## 2020-04-15 DIAGNOSIS — E119 Type 2 diabetes mellitus without complications: Secondary | ICD-10-CM | POA: Insufficient documentation

## 2020-04-15 DIAGNOSIS — M5441 Lumbago with sciatica, right side: Secondary | ICD-10-CM

## 2020-04-15 MED ORDER — FENTANYL CITRATE (PF) 100 MCG/2ML IJ SOLN
50.0000 ug | Freq: Once | INTRAMUSCULAR | Status: AC
  Administered 2020-04-15: 06:00:00 50 ug via INTRAVENOUS
  Filled 2020-04-15: qty 2

## 2020-04-15 MED ORDER — METHOCARBAMOL 1000 MG/10ML IJ SOLN
1000.0000 mg | Freq: Once | INTRAMUSCULAR | Status: DC
  Filled 2020-04-15: qty 10

## 2020-04-15 MED ORDER — HYDROCODONE-ACETAMINOPHEN 5-325 MG PO TABS
1.0000 | ORAL_TABLET | Freq: Four times a day (QID) | ORAL | 0 refills | Status: DC | PRN
Start: 2020-04-15 — End: 2020-04-15

## 2020-04-15 MED ORDER — METHOCARBAMOL 1000 MG/10ML IJ SOLN
1000.0000 mg | Freq: Once | INTRAVENOUS | Status: AC
  Administered 2020-04-15: 06:00:00 1000 mg via INTRAVENOUS
  Filled 2020-04-15: qty 1000

## 2020-04-15 MED ORDER — HYDROCODONE-ACETAMINOPHEN 5-325 MG PO TABS
1.0000 | ORAL_TABLET | Freq: Four times a day (QID) | ORAL | 0 refills | Status: DC | PRN
Start: 2020-04-15 — End: 2022-02-25

## 2020-04-15 MED ORDER — KETOROLAC TROMETHAMINE 30 MG/ML IJ SOLN
30.0000 mg | Freq: Once | INTRAMUSCULAR | Status: AC
  Administered 2020-04-15: 05:00:00 30 mg via INTRAMUSCULAR
  Filled 2020-04-15: qty 1

## 2020-04-15 MED ORDER — METHOCARBAMOL 500 MG PO TABS: 1000.0000 mg | ORAL_TABLET | Freq: Two times a day (BID) | ORAL | 0 refills | Status: DC

## 2020-04-15 MED ORDER — METHOCARBAMOL 500 MG PO TABS: 500.0000 mg | ORAL_TABLET | Freq: Two times a day (BID) | ORAL | 0 refills | Status: DC

## 2020-04-15 NOTE — Discharge Instructions (Signed)
Follow up with your primary care physician for further evaluation and management of back and abdominal pain. Take medications as prescribed.   Return to the emergency department with any new or worsening symptoms.

## 2020-04-15 NOTE — ED Provider Notes (Signed)
[facility]-EMERGENCY DEPT Provider Note   CSN: [id number] Arrival date & time: 04/15/20  0342     History Chief Complaint  Patient presents with   Flank Pain    Linda Gray is a 39 y.o. female.  Patient to ED with persistent/progressive pain in the RLQ abdomen extending to right flank and close to midline back. No fever, vomiting, constipation, urinary symptoms. She was seen by her doctor for same on 12/6 and diagnosed with microhematuria. A CT scan of abd/pel w/CM performed on 12/7 showed no cause for patient's symptoms, ie normal appendix, no kidney stones, no GI inflammation or evidence infection. She has been referred to GI for further work up. She comes to ED tonight with progressive pain that is preventing sleep.   The history is provided by the patient. No language interpreter was used.  Flank Pain Associated symptoms include abdominal pain. Pertinent negatives include no chest pain and no shortness of breath.       Past Medical History:  Diagnosis Date   ADD (attention deficit disorder)    Anxiety    Diabetes mellitus (HCC)    Headache    Hyperlipidemia    Hypertension     Patient Active Problem List   Diagnosis Date Noted   Class 2 severe obesity due to excess calories with serious comorbidity and body mass index (BMI) of 35.0 to 35.9 in adult [facility]) 01/01/2017   Nocturia more than twice per night 01/01/2017   Snoring 01/01/2017   Excessive daytime sleepiness 01/01/2017    History reviewed. No pertinent surgical history.   OB History   No obstetric history on file.     Family History  Problem Relation Age of Onset   Hypertension Father    Diabetes Father     Social History   Tobacco Use   Smoking status: Current Some Day Smoker   Smokeless tobacco: Never Used  Substance Use Topics   Alcohol use: Yes    Comment: occ   Drug use: No    Home Medications Prior to Admission medications   Medication Sig  Start Date End Date Taking? Authorizing Provider  amphetamine-dextroamphetamine (ADDERALL XR) 15 MG 24 hr capsule Take 1 capsule by mouth every morning. 01/01/17   Dohmeier, Porfirio Mylar, MD    Allergies    Clindamycin, Latex, Naltrexone-bupropion hcl er, Shellfish allergy, and Sumatriptan  Review of Systems   Review of Systems  Constitutional: Negative for chills and fever.  Respiratory: Negative.  Negative for shortness of breath.   Cardiovascular: Negative.  Negative for chest pain.  Gastrointestinal: Positive for abdominal pain. Negative for blood in stool, constipation and vomiting.  Genitourinary: Positive for flank pain. Negative for dysuria, hematuria and vaginal discharge.  Skin: Negative.   Neurological: Negative.  Negative for weakness.    Physical Exam Updated Vital Signs BP (!) 173/81    Pulse 89    Temp 98.3 F (36.8 C)    Resp 20    Ht 5' 2.5" (1.588 m)    Wt 94.8 kg    SpO2 100%    BMI 37.62 kg/m   Physical Exam Vitals and nursing note reviewed.  Constitutional:      Appearance: She is well-developed and well-nourished.  HENT:     Head: Normocephalic.  Cardiovascular:     Rate and Rhythm: Normal rate and regular rhythm.  Pulmonary:     Effort: Pulmonary effort is normal.     Breath sounds: Normal breath sounds.  Abdominal:  General: Bowel sounds are normal.     Palpations: Abdomen is soft.     Tenderness: There is abdominal tenderness. There is no guarding or rebound.    Musculoskeletal:        General: Normal range of motion.     Cervical back: Normal range of motion and neck supple.       Back:     Comments: No midline thoracic or lumber tenderness.   Skin:    General: Skin is warm and dry.     Findings: No rash.  Neurological:     Mental Status: She is alert and oriented to person, place, and time.  Psychiatric:        Mood and Affect: Mood and affect normal.     ED Results / Procedures / Treatments   Labs (all labs ordered are listed, but  only abnormal results are displayed) Labs Reviewed - No data to display  EKG None  Radiology No results found.  Procedures Procedures (including critical care time)  Medications Ordered in ED Medications  ketorolac (TORADOL) 30 MG/ML injection 30 mg (has no administration in time range)    ED Course  I have reviewed the triage vital signs and the nursing notes.  Pertinent labs & imaging results that were available during my care of the patient were reviewed by me and considered in my medical decision making (see chart for details).    MDM Rules/Calculators/A&P                          Patient to ED with ss/sxs as per HPI.  Chart reviewed - CT 12/6 w/CM negative for acute findings to explain pain.   Consider lumbar radiculopathy. This was discussed with the patient who then reports the pain radiates into her lateral right thigh at times.   IM Toradol provided with no relief. IV medications provided with relief of pain (Robaxin and Fentanyl). She is ready to be discharged home. Recommended PCP follow up.   Final Clinical Impression(s) / ED Diagnoses Final diagnoses:  None   1. Back pain 2. Abdominal pain   Rx / DC Orders ED Discharge Orders    None       Elpidio Anis, PA-C 04/15/20 0646    Molpus, Jonny Ruiz, MD 04/15/20 [phone]

## 2020-04-15 NOTE — Telephone Encounter (Cosign Needed)
Patient called ED requesting change in pharmacy. Prescription from previous provider sent to other pharmacy. Database checked prior to prescripting hydrocodone.

## 2020-04-15 NOTE — ED Triage Notes (Signed)
Pt c/o intermittent right sided flank pain starting last Thursday.

## 2020-12-22 DIAGNOSIS — Z124 Encounter for screening for malignant neoplasm of cervix: Secondary | ICD-10-CM | POA: Diagnosis not present

## 2020-12-22 DIAGNOSIS — Z113 Encounter for screening for infections with a predominantly sexual mode of transmission: Secondary | ICD-10-CM | POA: Diagnosis not present

## 2020-12-22 DIAGNOSIS — Z01419 Encounter for gynecological examination (general) (routine) without abnormal findings: Secondary | ICD-10-CM | POA: Diagnosis not present

## 2021-01-17 DIAGNOSIS — Z1231 Encounter for screening mammogram for malignant neoplasm of breast: Secondary | ICD-10-CM | POA: Diagnosis not present

## 2021-03-20 DIAGNOSIS — E119 Type 2 diabetes mellitus without complications: Secondary | ICD-10-CM | POA: Diagnosis not present

## 2021-03-20 DIAGNOSIS — E7800 Pure hypercholesterolemia, unspecified: Secondary | ICD-10-CM | POA: Diagnosis not present

## 2021-03-20 DIAGNOSIS — Z6831 Body mass index (BMI) 31.0-31.9, adult: Secondary | ICD-10-CM | POA: Diagnosis not present

## 2021-03-20 DIAGNOSIS — E785 Hyperlipidemia, unspecified: Secondary | ICD-10-CM | POA: Diagnosis not present

## 2021-03-20 DIAGNOSIS — I10 Essential (primary) hypertension: Secondary | ICD-10-CM | POA: Diagnosis not present

## 2021-03-20 DIAGNOSIS — E1169 Type 2 diabetes mellitus with other specified complication: Secondary | ICD-10-CM | POA: Diagnosis not present

## 2021-04-04 DIAGNOSIS — Z8669 Personal history of other diseases of the nervous system and sense organs: Secondary | ICD-10-CM | POA: Diagnosis not present

## 2021-04-04 DIAGNOSIS — E7800 Pure hypercholesterolemia, unspecified: Secondary | ICD-10-CM | POA: Diagnosis not present

## 2021-04-04 DIAGNOSIS — Z0000 Encounter for general adult medical examination without abnormal findings: Secondary | ICD-10-CM | POA: Diagnosis not present

## 2021-04-04 DIAGNOSIS — E1169 Type 2 diabetes mellitus with other specified complication: Secondary | ICD-10-CM | POA: Diagnosis not present

## 2021-04-04 DIAGNOSIS — Z794 Long term (current) use of insulin: Secondary | ICD-10-CM | POA: Diagnosis not present

## 2021-05-02 DIAGNOSIS — R051 Acute cough: Secondary | ICD-10-CM | POA: Diagnosis not present

## 2021-05-02 DIAGNOSIS — R0981 Nasal congestion: Secondary | ICD-10-CM | POA: Diagnosis not present

## 2021-05-02 DIAGNOSIS — R52 Pain, unspecified: Secondary | ICD-10-CM | POA: Diagnosis not present

## 2021-05-02 DIAGNOSIS — R6883 Chills (without fever): Secondary | ICD-10-CM | POA: Diagnosis not present

## 2021-05-08 DIAGNOSIS — Z6835 Body mass index (BMI) 35.0-35.9, adult: Secondary | ICD-10-CM | POA: Diagnosis not present

## 2021-05-08 DIAGNOSIS — E1169 Type 2 diabetes mellitus with other specified complication: Secondary | ICD-10-CM | POA: Diagnosis not present

## 2021-05-08 DIAGNOSIS — E785 Hyperlipidemia, unspecified: Secondary | ICD-10-CM | POA: Diagnosis not present

## 2021-05-08 DIAGNOSIS — E1140 Type 2 diabetes mellitus with diabetic neuropathy, unspecified: Secondary | ICD-10-CM | POA: Diagnosis not present

## 2021-07-12 DIAGNOSIS — H00023 Hordeolum internum right eye, unspecified eyelid: Secondary | ICD-10-CM | POA: Diagnosis not present

## 2021-07-18 DIAGNOSIS — E1140 Type 2 diabetes mellitus with diabetic neuropathy, unspecified: Secondary | ICD-10-CM | POA: Diagnosis not present

## 2021-07-18 DIAGNOSIS — E785 Hyperlipidemia, unspecified: Secondary | ICD-10-CM | POA: Diagnosis not present

## 2021-07-18 DIAGNOSIS — Z6835 Body mass index (BMI) 35.0-35.9, adult: Secondary | ICD-10-CM | POA: Diagnosis not present

## 2021-07-18 DIAGNOSIS — E1169 Type 2 diabetes mellitus with other specified complication: Secondary | ICD-10-CM | POA: Diagnosis not present

## 2021-07-18 DIAGNOSIS — E119 Type 2 diabetes mellitus without complications: Secondary | ICD-10-CM | POA: Diagnosis not present

## 2021-11-02 DIAGNOSIS — M722 Plantar fascial fibromatosis: Secondary | ICD-10-CM | POA: Diagnosis not present

## 2021-11-02 DIAGNOSIS — G43009 Migraine without aura, not intractable, without status migrainosus: Secondary | ICD-10-CM | POA: Diagnosis not present

## 2021-11-02 DIAGNOSIS — G629 Polyneuropathy, unspecified: Secondary | ICD-10-CM | POA: Diagnosis not present

## 2021-11-15 ENCOUNTER — Ambulatory Visit (INDEPENDENT_AMBULATORY_CARE_PROVIDER_SITE_OTHER): Payer: BC Managed Care – PPO | Admitting: Podiatry

## 2021-11-15 ENCOUNTER — Ambulatory Visit (INDEPENDENT_AMBULATORY_CARE_PROVIDER_SITE_OTHER): Payer: BC Managed Care – PPO

## 2021-11-15 ENCOUNTER — Encounter: Payer: Self-pay | Admitting: Podiatry

## 2021-11-15 DIAGNOSIS — M722 Plantar fascial fibromatosis: Secondary | ICD-10-CM | POA: Diagnosis not present

## 2021-11-15 DIAGNOSIS — M5416 Radiculopathy, lumbar region: Secondary | ICD-10-CM | POA: Diagnosis not present

## 2021-11-15 DIAGNOSIS — G629 Polyneuropathy, unspecified: Secondary | ICD-10-CM | POA: Diagnosis not present

## 2021-11-15 DIAGNOSIS — M79671 Pain in right foot: Secondary | ICD-10-CM

## 2021-11-15 DIAGNOSIS — M7732 Calcaneal spur, left foot: Secondary | ICD-10-CM | POA: Diagnosis not present

## 2021-11-15 DIAGNOSIS — M79672 Pain in left foot: Secondary | ICD-10-CM

## 2021-11-15 DIAGNOSIS — M7731 Calcaneal spur, right foot: Secondary | ICD-10-CM | POA: Diagnosis not present

## 2021-11-15 MED ORDER — DEXAMETHASONE SODIUM PHOSPHATE 120 MG/30ML IJ SOLN
4.0000 mg | Freq: Once | INTRAMUSCULAR | Status: AC
  Administered 2021-11-15: 4 mg via INTRA_ARTICULAR

## 2021-11-15 MED ORDER — MELOXICAM 15 MG PO TABS: 15.0000 mg | ORAL_TABLET | Freq: Every day | ORAL | 0 refills | Status: DC

## 2021-11-15 NOTE — Patient Instructions (Signed)

## 2021-11-15 NOTE — Progress Notes (Signed)
  Subjective:  Patient ID: Linda Gray, female    DOB: Nov 18, 1980,   MRN: [id number]  Chief Complaint  Patient presents with   Foot Pain    bilateral heel pain, right is worse     41 y.o. female presents for concern of bilateral foot pain that has been going on for about 5-6 weeks. She relates the right is the worst and mostly in the heels. Relates it is affected her gait. States she gets shooting pain and numbness in her heels. Relates her PCP cave her medication and CBD cream to help without much relief. She is diabeitc and last A1c was around 7.   Denies any other pedal complaints. Denies n/v/f/c.   Past Medical History:  Diagnosis Date   ADD (attention deficit disorder)    Anxiety    Diabetes mellitus (HCC)    Headache    Hyperlipidemia    Hypertension     Objective:  Physical Exam: Vascular: DP/PT pulses 2/4 bilateral. CFT <3 seconds. Normal hair growth on digits. No edema.  Skin. No lacerations or abrasions bilateral feet.  Musculoskeletal: MMT 5/5 bilateral lower extremities in DF, PF, Inversion and Eversion. Deceased ROM in DF of ankle joint. Tender circumferentially around both feet and spreading up into both legs. There is some more concentrated pain in the right medial calcaneal tubercle.  Neurological: Sensation intact to light touch.   Assessment:   1. Pain in both feet      Plan:  Patient was evaluated and treated and all questions answered. Discussed plantar fasciitis with patient.  X-rays reviewed and discussed with patient. No acute fractures or dislocations noted. Mild spurring noted at inferior calcaneus.  Discussed treatment options including, ice, NSAIDS, supportive shoes, bracing, and stretching. Stretching exercises provided to be done on a daily basis.   Prescription for meloxicam provided and sent to pharmacy. Patient requesting injection today. Procedure note below.   -Discussed neuropathy and radiculopathy as causes of pain as well and following  up with PCP for prescription management as well as management of possible etiology from back.  Follow-up 6 weeks or sooner if any problems arise. In the meantime, encouraged to call the office with any questions, concerns, change in symptoms.   Procedure:  Discussed etiology, pathology, conservative vs. surgical therapies. At this time a plantar fascial injection was recommended.  The patient agreed and a sterile skin prep was applied.  An injection consisting of  dexamethasone and marcaine mixture was infiltrated at the point of maximal tenderness on the right Heel.  Bandaid applied. The patient tolerated this well and was given instructions for aftercare.    Louann Sjogren, DPM

## 2021-11-27 DIAGNOSIS — M25562 Pain in left knee: Secondary | ICD-10-CM | POA: Diagnosis not present

## 2021-11-27 DIAGNOSIS — M25561 Pain in right knee: Secondary | ICD-10-CM | POA: Diagnosis not present

## 2021-11-27 DIAGNOSIS — E1140 Type 2 diabetes mellitus with diabetic neuropathy, unspecified: Secondary | ICD-10-CM | POA: Diagnosis not present

## 2021-11-27 DIAGNOSIS — M7989 Other specified soft tissue disorders: Secondary | ICD-10-CM | POA: Diagnosis not present

## 2021-11-27 DIAGNOSIS — M2550 Pain in unspecified joint: Secondary | ICD-10-CM | POA: Diagnosis not present

## 2021-12-05 DIAGNOSIS — M2550 Pain in unspecified joint: Secondary | ICD-10-CM | POA: Diagnosis not present

## 2021-12-05 DIAGNOSIS — Z8261 Family history of arthritis: Secondary | ICD-10-CM | POA: Diagnosis not present

## 2021-12-05 DIAGNOSIS — M791 Myalgia, unspecified site: Secondary | ICD-10-CM | POA: Diagnosis not present

## 2021-12-05 DIAGNOSIS — M7989 Other specified soft tissue disorders: Secondary | ICD-10-CM | POA: Diagnosis not present

## 2021-12-27 ENCOUNTER — Ambulatory Visit: Payer: BC Managed Care – PPO | Admitting: Podiatry

## 2022-01-02 DIAGNOSIS — M791 Myalgia, unspecified site: Secondary | ICD-10-CM | POA: Diagnosis not present

## 2022-01-02 DIAGNOSIS — M7989 Other specified soft tissue disorders: Secondary | ICD-10-CM | POA: Diagnosis not present

## 2022-01-02 DIAGNOSIS — Z0289 Encounter for other administrative examinations: Secondary | ICD-10-CM | POA: Diagnosis not present

## 2022-01-02 DIAGNOSIS — M2550 Pain in unspecified joint: Secondary | ICD-10-CM | POA: Diagnosis not present

## 2022-01-09 DIAGNOSIS — E7800 Pure hypercholesterolemia, unspecified: Secondary | ICD-10-CM | POA: Diagnosis not present

## 2022-01-09 DIAGNOSIS — E1169 Type 2 diabetes mellitus with other specified complication: Secondary | ICD-10-CM | POA: Diagnosis not present

## 2022-01-09 DIAGNOSIS — I10 Essential (primary) hypertension: Secondary | ICD-10-CM | POA: Diagnosis not present

## 2022-01-10 DIAGNOSIS — M25471 Effusion, right ankle: Secondary | ICD-10-CM | POA: Diagnosis not present

## 2022-01-10 DIAGNOSIS — M1990 Unspecified osteoarthritis, unspecified site: Secondary | ICD-10-CM | POA: Diagnosis not present

## 2022-01-10 DIAGNOSIS — G629 Polyneuropathy, unspecified: Secondary | ICD-10-CM | POA: Diagnosis not present

## 2022-01-10 DIAGNOSIS — E1169 Type 2 diabetes mellitus with other specified complication: Secondary | ICD-10-CM | POA: Diagnosis not present

## 2022-01-22 DIAGNOSIS — I10 Essential (primary) hypertension: Secondary | ICD-10-CM | POA: Diagnosis not present

## 2022-01-22 DIAGNOSIS — E7800 Pure hypercholesterolemia, unspecified: Secondary | ICD-10-CM | POA: Diagnosis not present

## 2022-01-22 DIAGNOSIS — E1140 Type 2 diabetes mellitus with diabetic neuropathy, unspecified: Secondary | ICD-10-CM | POA: Diagnosis not present

## 2022-01-22 DIAGNOSIS — G629 Polyneuropathy, unspecified: Secondary | ICD-10-CM | POA: Diagnosis not present

## 2022-01-30 DIAGNOSIS — M2550 Pain in unspecified joint: Secondary | ICD-10-CM | POA: Diagnosis not present

## 2022-01-30 DIAGNOSIS — M791 Myalgia, unspecified site: Secondary | ICD-10-CM | POA: Diagnosis not present

## 2022-01-30 DIAGNOSIS — M7989 Other specified soft tissue disorders: Secondary | ICD-10-CM | POA: Diagnosis not present

## 2022-01-30 DIAGNOSIS — Z0289 Encounter for other administrative examinations: Secondary | ICD-10-CM | POA: Diagnosis not present

## 2022-02-06 DIAGNOSIS — M25571 Pain in right ankle and joints of right foot: Secondary | ICD-10-CM | POA: Diagnosis not present

## 2022-02-06 DIAGNOSIS — M79642 Pain in left hand: Secondary | ICD-10-CM | POA: Diagnosis not present

## 2022-02-06 DIAGNOSIS — M2550 Pain in unspecified joint: Secondary | ICD-10-CM | POA: Diagnosis not present

## 2022-02-06 DIAGNOSIS — M25572 Pain in left ankle and joints of left foot: Secondary | ICD-10-CM | POA: Diagnosis not present

## 2022-02-06 DIAGNOSIS — M797 Fibromyalgia: Secondary | ICD-10-CM | POA: Diagnosis not present

## 2022-02-06 DIAGNOSIS — M7989 Other specified soft tissue disorders: Secondary | ICD-10-CM | POA: Diagnosis not present

## 2022-02-06 DIAGNOSIS — M79641 Pain in right hand: Secondary | ICD-10-CM | POA: Diagnosis not present

## 2022-02-12 DIAGNOSIS — R350 Frequency of micturition: Secondary | ICD-10-CM | POA: Diagnosis not present

## 2022-02-12 DIAGNOSIS — Z113 Encounter for screening for infections with a predominantly sexual mode of transmission: Secondary | ICD-10-CM | POA: Diagnosis not present

## 2022-02-12 DIAGNOSIS — Z01419 Encounter for gynecological examination (general) (routine) without abnormal findings: Secondary | ICD-10-CM | POA: Diagnosis not present

## 2022-02-12 DIAGNOSIS — Z1231 Encounter for screening mammogram for malignant neoplasm of breast: Secondary | ICD-10-CM | POA: Diagnosis not present

## 2022-02-12 DIAGNOSIS — Z6835 Body mass index (BMI) 35.0-35.9, adult: Secondary | ICD-10-CM | POA: Diagnosis not present

## 2022-02-18 DIAGNOSIS — M797 Fibromyalgia: Secondary | ICD-10-CM | POA: Diagnosis not present

## 2022-02-18 DIAGNOSIS — M2550 Pain in unspecified joint: Secondary | ICD-10-CM | POA: Diagnosis not present

## 2022-02-18 DIAGNOSIS — M1990 Unspecified osteoarthritis, unspecified site: Secondary | ICD-10-CM | POA: Diagnosis not present

## 2022-02-25 ENCOUNTER — Ambulatory Visit (INDEPENDENT_AMBULATORY_CARE_PROVIDER_SITE_OTHER): Payer: BC Managed Care – PPO | Admitting: Podiatry

## 2022-02-25 ENCOUNTER — Encounter: Payer: Self-pay | Admitting: Podiatry

## 2022-02-25 DIAGNOSIS — M722 Plantar fascial fibromatosis: Secondary | ICD-10-CM

## 2022-02-25 MED ORDER — TRIAMCINOLONE ACETONIDE 10 MG/ML IJ SUSP: 20.0000 mg | Freq: Once | INTRAMUSCULAR | Status: AC

## 2022-02-25 NOTE — Progress Notes (Signed)
Subjective:   Patient ID: Linda Gray, female   DOB: 41 y.o.   MRN: [id number]   HPI Patient states both of her heels have started to kill her.  States that the right is worse than the left and she also has something going on and is seeing a rheumatologist and is on methotrexate with a lot of joint pain.  States that it is hard for her to exercise or do activity because of the pain and its been going on for at least 2 months now and only had 4 to 6 weeks of relief   ROS      Objective:  Physical Exam  Neurovascular status intact muscle strength was found to be active with patient found to have exquisite discomfort in the medial joint operative process right over left with fluid buildup around the medial band.  Patient is found to have good digital perfusion well-oriented x3 with moderate depression of the arch     Assessment:  Acute plantar fasciitis bilateral with inflammation fluid of the medial band right over left with structural deformity     Plan:  H&P reviewed conditions went ahead today did sterile prep and injected the plantar fascia bilateral and insertion 3 mg Kenalog 5 mg Xylocaine and applied night splint to the right which was fitted to her lower leg along with aggressive ice therapy.  Casted for functional orthotics to try to lift up her arches and take pressure off them and may require other treatments or this may also have some systemic elements to it.  Reappoint when orthotics are ready or earlier if needed

## 2022-02-26 DIAGNOSIS — M2550 Pain in unspecified joint: Secondary | ICD-10-CM | POA: Diagnosis not present

## 2022-02-26 DIAGNOSIS — Z0289 Encounter for other administrative examinations: Secondary | ICD-10-CM | POA: Diagnosis not present

## 2022-02-26 DIAGNOSIS — M791 Myalgia, unspecified site: Secondary | ICD-10-CM | POA: Diagnosis not present

## 2022-02-26 DIAGNOSIS — M7989 Other specified soft tissue disorders: Secondary | ICD-10-CM | POA: Diagnosis not present

## 2022-03-18 ENCOUNTER — Telehealth: Payer: Self-pay | Admitting: Podiatry

## 2022-03-18 NOTE — Telephone Encounter (Signed)
LMOM to call back to schedule appt to pick up orthotics . Patient has a balance of $91.50 left on the orthotics.

## 2022-03-25 DIAGNOSIS — I739 Peripheral vascular disease, unspecified: Secondary | ICD-10-CM | POA: Diagnosis not present

## 2022-03-25 DIAGNOSIS — E119 Type 2 diabetes mellitus without complications: Secondary | ICD-10-CM | POA: Diagnosis not present

## 2022-03-25 DIAGNOSIS — M791 Myalgia, unspecified site: Secondary | ICD-10-CM | POA: Diagnosis not present

## 2022-03-25 DIAGNOSIS — R12 Heartburn: Secondary | ICD-10-CM | POA: Diagnosis not present

## 2022-03-25 DIAGNOSIS — R0789 Other chest pain: Secondary | ICD-10-CM | POA: Diagnosis not present

## 2022-03-27 DIAGNOSIS — R0789 Other chest pain: Secondary | ICD-10-CM | POA: Diagnosis not present

## 2022-04-11 ENCOUNTER — Ambulatory Visit (INDEPENDENT_AMBULATORY_CARE_PROVIDER_SITE_OTHER): Payer: BC Managed Care – PPO | Admitting: Podiatry

## 2022-04-11 DIAGNOSIS — M722 Plantar fascial fibromatosis: Secondary | ICD-10-CM

## 2022-04-11 NOTE — Progress Notes (Signed)
Patient presents today to pick up custom molded foot orthotics recommended by Dr. Charlsie Merles.   Orthotics were dispensed and fit was satisfactory. Reviewed instructions for break-in and wear. Written instructions given to patient.  Patient will follow up as needed.   [facility] - order # [id number]

## 2022-04-18 DIAGNOSIS — M79672 Pain in left foot: Secondary | ICD-10-CM | POA: Diagnosis not present

## 2022-04-18 DIAGNOSIS — M79671 Pain in right foot: Secondary | ICD-10-CM | POA: Diagnosis not present

## 2022-04-19 DIAGNOSIS — I10 Essential (primary) hypertension: Secondary | ICD-10-CM | POA: Diagnosis not present

## 2022-04-19 DIAGNOSIS — E7800 Pure hypercholesterolemia, unspecified: Secondary | ICD-10-CM | POA: Diagnosis not present

## 2022-04-19 DIAGNOSIS — E1140 Type 2 diabetes mellitus with diabetic neuropathy, unspecified: Secondary | ICD-10-CM | POA: Diagnosis not present

## 2022-04-19 DIAGNOSIS — Z0000 Encounter for general adult medical examination without abnormal findings: Secondary | ICD-10-CM | POA: Diagnosis not present

## 2022-04-19 DIAGNOSIS — E1169 Type 2 diabetes mellitus with other specified complication: Secondary | ICD-10-CM | POA: Diagnosis not present

## 2022-04-25 ENCOUNTER — Other Ambulatory Visit (HOSPITAL_COMMUNITY): Payer: Self-pay

## 2022-04-25 DIAGNOSIS — M7989 Other specified soft tissue disorders: Secondary | ICD-10-CM | POA: Diagnosis not present

## 2022-04-25 DIAGNOSIS — R051 Acute cough: Secondary | ICD-10-CM | POA: Diagnosis not present

## 2022-04-25 DIAGNOSIS — R042 Hemoptysis: Secondary | ICD-10-CM | POA: Diagnosis not present

## 2022-04-25 DIAGNOSIS — E1169 Type 2 diabetes mellitus with other specified complication: Secondary | ICD-10-CM | POA: Diagnosis not present

## 2022-04-25 DIAGNOSIS — Z0000 Encounter for general adult medical examination without abnormal findings: Secondary | ICD-10-CM | POA: Diagnosis not present

## 2022-04-25 DIAGNOSIS — M791 Myalgia, unspecified site: Secondary | ICD-10-CM | POA: Diagnosis not present

## 2022-04-25 DIAGNOSIS — E7800 Pure hypercholesterolemia, unspecified: Secondary | ICD-10-CM

## 2022-04-25 DIAGNOSIS — M2550 Pain in unspecified joint: Secondary | ICD-10-CM | POA: Diagnosis not present

## 2022-04-26 DIAGNOSIS — I10 Essential (primary) hypertension: Secondary | ICD-10-CM | POA: Diagnosis not present

## 2022-04-26 DIAGNOSIS — E1165 Type 2 diabetes mellitus with hyperglycemia: Secondary | ICD-10-CM | POA: Diagnosis not present

## 2022-04-26 DIAGNOSIS — G629 Polyneuropathy, unspecified: Secondary | ICD-10-CM | POA: Diagnosis not present

## 2022-04-26 DIAGNOSIS — E785 Hyperlipidemia, unspecified: Secondary | ICD-10-CM | POA: Diagnosis not present

## 2022-04-26 DIAGNOSIS — E1169 Type 2 diabetes mellitus with other specified complication: Secondary | ICD-10-CM | POA: Diagnosis not present

## 2022-05-16 ENCOUNTER — Encounter (HOSPITAL_COMMUNITY): Payer: Self-pay

## 2022-05-16 ENCOUNTER — Ambulatory Visit (HOSPITAL_COMMUNITY): Payer: BC Managed Care – PPO

## 2022-08-05 DIAGNOSIS — E1165 Type 2 diabetes mellitus with hyperglycemia: Secondary | ICD-10-CM | POA: Diagnosis not present

## 2022-08-05 DIAGNOSIS — G629 Polyneuropathy, unspecified: Secondary | ICD-10-CM | POA: Diagnosis not present

## 2022-08-05 DIAGNOSIS — I10 Essential (primary) hypertension: Secondary | ICD-10-CM | POA: Diagnosis not present

## 2022-08-05 DIAGNOSIS — E785 Hyperlipidemia, unspecified: Secondary | ICD-10-CM | POA: Diagnosis not present

## 2022-08-21 ENCOUNTER — Emergency Department (HOSPITAL_COMMUNITY)
Admission: EM | Admit: 2022-08-21 | Discharge: 2022-08-21 | Disposition: A | Discharge status: Home / Self Care | Payer: BC Managed Care – PPO | Attending: Emergency Medicine | Admitting: Emergency Medicine

## 2022-08-21 ENCOUNTER — Encounter (HOSPITAL_COMMUNITY): Payer: Self-pay

## 2022-08-21 ENCOUNTER — Other Ambulatory Visit: Payer: Self-pay

## 2022-08-21 ENCOUNTER — Emergency Department (HOSPITAL_COMMUNITY): Payer: BC Managed Care – PPO

## 2022-08-21 DIAGNOSIS — R1032 Left lower quadrant pain: Secondary | ICD-10-CM

## 2022-08-21 DIAGNOSIS — E119 Type 2 diabetes mellitus without complications: Secondary | ICD-10-CM | POA: Diagnosis not present

## 2022-08-21 DIAGNOSIS — Z7984 Long term (current) use of oral hypoglycemic drugs: Secondary | ICD-10-CM | POA: Insufficient documentation

## 2022-08-21 DIAGNOSIS — Z79899 Other long term (current) drug therapy: Secondary | ICD-10-CM | POA: Insufficient documentation

## 2022-08-21 DIAGNOSIS — I10 Essential (primary) hypertension: Secondary | ICD-10-CM | POA: Insufficient documentation

## 2022-08-21 LAB — URINALYSIS, ROUTINE W REFLEX MICROSCOPIC
Bilirubin Urine: NEGATIVE
Glucose, UA: NEGATIVE mg/dL
Ketones, ur: NEGATIVE mg/dL
Leukocytes,Ua: NEGATIVE
Nitrite: NEGATIVE
Protein, ur: NEGATIVE mg/dL
Specific Gravity, Urine: 1.009 (ref 1.005–1.030)
pH: 6 (ref 5.0–8.0)

## 2022-08-21 LAB — COMPREHENSIVE METABOLIC PANEL
ALT: 25 U/L (ref 0–44)
AST: 28 U/L (ref 15–41)
Albumin: 3.8 g/dL (ref 3.5–5.0)
Alkaline Phosphatase: 73 U/L (ref 38–126)
Anion gap: 9 (ref 5–15)
BUN: 9 mg/dL (ref 6–20)
CO2: 26 mmol/L (ref 22–32)
Calcium: 9 mg/dL (ref 8.9–10.3)
Chloride: 100 mmol/L (ref 98–111)
Creatinine, Ser: 0.84 mg/dL (ref 0.44–1.00)
GFR, Estimated: >60 mL/min (ref >60)
Glucose, Bld: 128 mg/dL — ABNORMAL HIGH (ref 70–99)
Potassium: 4 mmol/L (ref 3.5–5.1)
Sodium: 135 mmol/L (ref 135–145)
Total Bilirubin: 0.5 mg/dL (ref 0.3–1.2)
Total Protein: 8.3 g/dL — ABNORMAL HIGH (ref 6.5–8.1)

## 2022-08-21 LAB — I-STAT BETA HCG BLOOD, ED (MC, WL, AP ONLY): I-stat hCG, quantitative: <5 m[IU]/mL (ref <5)

## 2022-08-21 LAB — CBC WITH DIFFERENTIAL/PLATELET
Abs Immature Granulocytes: 0.05 10*3/uL (ref 0.00–0.07)
Basophils Absolute: 0.1 10*3/uL (ref 0.0–0.1)
Basophils Relative: 1 %
Eosinophils Absolute: 0.5 10*3/uL (ref 0.0–0.5)
Eosinophils Relative: 6 %
HCT: 39.3 % (ref 36.0–46.0)
Hemoglobin: 12.5 g/dL (ref 12.0–15.0)
Immature Granulocytes: 1 %
Lymphocytes Relative: 31 %
Lymphs Abs: 2.5 10*3/uL (ref 0.7–4.0)
MCH: 26.6 pg (ref 26.0–34.0)
MCHC: 31.8 g/dL (ref 30.0–36.0)
MCV: 83.6 fL (ref 80.0–100.0)
Monocytes Absolute: 0.8 10*3/uL (ref 0.1–1.0)
Monocytes Relative: 10 %
Neutro Abs: 4.2 10*3/uL (ref 1.7–7.7)
Neutrophils Relative %: 51 %
Platelets: 297 10*3/uL (ref 150–400)
RBC: 4.7 MIL/uL (ref 3.87–5.11)
RDW: 13.4 % (ref 11.5–15.5)
WBC: 8.1 10*3/uL (ref 4.0–10.5)
nRBC: 0 % (ref 0.0–0.2)

## 2022-08-21 LAB — LIPASE, BLOOD: Lipase: 36 U/L (ref 11–51)

## 2022-08-21 MED ORDER — SODIUM CHLORIDE (PF) 0.9 % IJ SOLN
INTRAMUSCULAR | Status: AC
  Filled 2022-08-21: qty 50

## 2022-08-21 MED ORDER — MORPHINE SULFATE (PF) 4 MG/ML IV SOLN
4.0000 mg | Freq: Once | INTRAVENOUS | Status: AC
  Administered 2022-08-21: 4 mg via INTRAVENOUS
  Filled 2022-08-21: qty 1

## 2022-08-21 MED ORDER — HYDROCODONE-ACETAMINOPHEN 5-325 MG PO TABS: 1.0000 | ORAL_TABLET | Freq: Four times a day (QID) | ORAL | 0 refills | Status: AC | PRN

## 2022-08-21 MED ORDER — ONDANSETRON HCL 4 MG/2ML IJ SOLN
4.0000 mg | Freq: Once | INTRAMUSCULAR | Status: AC
  Administered 2022-08-21: 4 mg via INTRAVENOUS
  Filled 2022-08-21: qty 2

## 2022-08-21 MED ORDER — IOHEXOL 300 MG/ML  SOLN
100.0000 mL | Freq: Once | INTRAMUSCULAR | Status: AC | PRN
  Administered 2022-08-21: 100 mL via INTRAVENOUS

## 2022-08-21 NOTE — ED Triage Notes (Signed)
Patient is here for left lower abdominal pain. Patient reports nausea and vomiting that started today. Patient reports history of diverticulitis.

## 2022-08-21 NOTE — ED Provider Notes (Signed)
[place] EMERGENCY DEPARTMENT AT [facility] Provider Note   CSN: [id number] Arrival date & time: 08/21/22  0957     History  Chief Complaint  Patient presents with   Abdominal Pain    Linda Gray is a 42 y.o. female.  Patient is a 42 year old female with a history of diabetes, hyperlipidemia, hypertension and prior diverticulitis who is presenting today with sudden onset of severe left-sided pain.  She reports it started at work and has been so severe at times it is caused her to vomit.  She continued to feel like she needed to use the bathroom but denies any diarrhea or urinary burning or discomfort.  Currently the pain is a 5 out of 10 but is still present on the left side.  She reports feeling completely fine yesterday and this morning prior to symptoms starting.  The history is provided by the patient.  Abdominal Pain      Home Medications Prior to Admission medications   Medication Sig Start Date End Date Taking? Authorizing Provider  HYDROcodone-acetaminophen (NORCO/VICODIN) 5-325 MG tablet Take 1 tablet by mouth every 6 (six) hours as needed for severe pain. 08/21/22  Yes Gwyneth Sprout, MD  albuterol (VENTOLIN HFA) 108 (90 Base) MCG/ACT inhaler albuterol sulfate HFA 90 mcg/actuation aerosol inhaler 06/16/19   [provider]  amphetamine-dextroamphetamine (ADDERALL XR) 15 MG 24 hr capsule Take 1 capsule by mouth every morning. 01/01/17   Dohmeier, Porfirio Mylar, MD  Dulaglutide (TRULICITY) 0.75 MG/0.5ML SOPN USE AS DIRECTED SUBCUTANEOUS ONCE A WEEK 12/18/21   [provider]  empagliflozin (JARDIANCE) 10 MG TABS tablet Take 1 tablet by mouth daily. 02/05/22   [provider]  EPINEPHrine 0.3 mg/0.3 mL IJ SOAJ injection     [provider]  gabapentin (NEURONTIN) 300 MG capsule TAKE ONE CAPSULE BY MOUTH EVERY MORNING AND 2 CAPSULES EVERY EVENING 01/02/22   [provider]  levonorgestrel (MIRENA, 52 MG,) 20 MCG/DAY IUD  Mirena 20 mcg/24 hours (6 yrs) 52 mg intrauterine device  Take 1 device by intrauterine route. 04/17/16   [provider]  methotrexate (RHEUMATREX) 2.5 MG tablet Take by mouth. 02/18/22   [provider]  sulfaSALAzine (AZULFIDINE) 500 MG tablet Take by mouth. 02/22/22   [provider]  Ubrogepant (UBRELVY) 100 MG TABS  12/04/21   [provider]      Allergies    Clindamycin, Latex, Naltrexone-bupropion hcl er, Shellfish allergy, and Sumatriptan    Review of Systems   Review of Systems  Gastrointestinal:  Positive for abdominal pain.    Physical Exam Updated Vital Signs BP (!) 124/90   Pulse 93   Temp 97.8 F (36.6 C) (Oral)   Resp (!) 23   Ht  (1.6 m)   Wt 89.8 kg   SpO2 99%   BMI 35.07 kg/m  Physical Exam Vitals and nursing note reviewed.  Constitutional:      General: She is not in acute distress.    Appearance: She is well-developed.  HENT:     Head: Normocephalic and atraumatic.  Eyes:     Conjunctiva/sclera: Conjunctivae normal.     Pupils: Pupils are equal, round, and reactive to light.  Cardiovascular:     Rate and Rhythm: Normal rate and regular rhythm.     Heart sounds: No murmur heard. Pulmonary:     Effort: Pulmonary effort is normal. No respiratory distress.     Breath sounds: Normal breath sounds. No wheezing or rales.  Abdominal:  General: There is no distension.     Palpations: Abdomen is soft.     Tenderness: There is abdominal tenderness in the left lower quadrant. There is left CVA tenderness. There is no guarding or rebound.  Musculoskeletal:        General: No tenderness. Normal range of motion.     Cervical back: Normal range of motion and neck supple.  Skin:    General: Skin is warm and dry.     Findings: No erythema or rash.  Neurological:     Mental Status: She is alert and oriented to person, place, and time.  Psychiatric:        Behavior: Behavior normal.     ED Results / Procedures /  Treatments   Labs (all labs ordered are listed, but only abnormal results are displayed) Labs Reviewed  COMPREHENSIVE METABOLIC PANEL - Abnormal; Notable for the following components:      Result Value   Glucose, Bld 128 (*)    Total Protein 8.3 (*)    All other components within normal limits  URINALYSIS, ROUTINE W REFLEX MICROSCOPIC - Abnormal; Notable for the following components:   Hgb urine dipstick MODERATE (*)    Bacteria, UA FEW (*)    All other components within normal limits  CBC WITH DIFFERENTIAL/PLATELET  LIPASE, BLOOD  I-STAT BETA HCG BLOOD, ED ([facility], [facility], [facility] ONLY)    EKG None  Radiology CT ABDOMEN PELVIS W CONTRAST  Result Date: 08/21/2022 CLINICAL DATA:  Sudden onset left lower quadrant abdominal pain. History of diverticulitis. EXAM: CT ABDOMEN AND PELVIS WITH CONTRAST TECHNIQUE: Multidetector CT imaging of the abdomen and pelvis was performed using the standard protocol following bolus administration of intravenous contrast. RADIATION DOSE REDUCTION: This exam was performed according to the departmental dose-optimization program which includes automated exposure control, adjustment of the mA and/or kV according to patient size and/or use of iterative reconstruction technique. CONTRAST:  OMNIPAQUE IOHEXOL 300 MG/ML  SOLN COMPARISON:  Abdominopelvic CT 04/11/2020 FINDINGS: Lower chest: Clear lung bases. No significant pleural or pericardial effusion. Hepatobiliary: The liver is normal in density without suspicious focal abnormality. No evidence of gallstones, gallbladder wall thickening or biliary dilatation. Pancreas: Unremarkable. No pancreatic ductal dilatation or surrounding inflammatory changes. Spleen: Normal in size without focal abnormality. Adrenals/Urinary Tract: Both adrenal glands appear normal. No evidence of urinary tract calculus, suspicious renal lesion or hydronephrosis. The bladder appears unremarkable for its degree of distention. Stomach/Bowel: No enteric  contrast administered. Possible mild distal esophageal wall thickening without significant hiatal hernia. The stomach appears unremarkable for its degree of distension. No evidence of bowel wall thickening, distention or surrounding inflammatory change. Vascular/Lymphatic: There are new mildly prominent lymph nodes in the gastrohepatic ligament, measuring up to 6 mm in diameter on image 16/2. There is a mildly prominent portacaval node measuring 1.2 cm on image 24/2. No other enlarged abdominopelvic lymph nodes are seen. No significant vascular findings. Reproductive: Intrauterine device in place. The uterus and ovaries otherwise appear unremarkable. Other: No evidence of abdominal wall mass or hernia. No ascites. Musculoskeletal: No acute or significant osseous findings. IMPRESSION: 1. No definite acute findings or explanation for the patient's symptoms. No evidence of diverticulitis. 2. Possible mild distal esophageal wall thickening with new mildly prominent lymph nodes in the gastrohepatic ligament and portacaval region. These findings are nonspecific, and could be secondary to esophagitis. Consider further evaluation with upper endoscopy. 3. No evidence of bowel obstruction or perforation. 4. Intrauterine device in place. Electronically Signed  ByCarey Bullockseazey M.D.   On: 08/21/2022 13:45    Procedures Procedures    Medications Ordered in ED Medications  morphine (PF) 4 MG/ML injection 4 mg (4 mg Intravenous Given 08/21/22 1234)  ondansetron (ZOFRAN) injection 4 mg (4 mg Intravenous Given 08/21/22 1234)  iohexol (OMNIPAQUE) 300 MG/ML solution 100 mL (100 mLs Intravenous Contrast Given 08/21/22 1318)    ED Course/ Medical Decision Making/ A&P                             Medical Decision Making Amount and/or Complexity of Data Reviewed Labs: ordered. Decision-making details documented in ED Course. Radiology: ordered and independent interpretation performed. Decision-making details documented  in ED Course.  Risk Prescription drug management.   Pt with multiple medical problems and comorbidities and presenting today with a complaint that caries a high risk for morbidity and mortality.  Here today with abdominal pain and vomiting.  Concern for renal stone versus diverticulitis versus perforation.  Low suspicion for AAA, UTI or pyelonephritis.  Based on the location of the pain lower suspicion for ovarian torsion.  I independently interpreted patient's labs hCG, CBC are within normal limits, CMP without acute findings, lipase is normal. uA with blood but no infection. CT is pending.  Patient given pain control.  3:18 PM I have independently visualized and interpreted pt's images today.  CT of the abdomen pelvis is negative for diverticulitis, perforation or other acute process.  However patient did have blood in her urine and now pain is significantly improved.  Suspect she most likely passed a small renal stone.  Patient was found to have some thickening in her esophagus as well as some lymph nodes which was discussed with the patient and her family member who is present at bedside.  She will follow-up with her PCP for endoscopy in the future.  This time feel that patient is stable for discharge.          Final Clinical Impression(s) / ED Diagnoses Final diagnoses:  Left lower quadrant abdominal pain    Rx / DC Orders ED Discharge Orders          Ordered    HYDROcodone-acetaminophen (NORCO/VICODIN) 5-325 MG tablet  Every 6 hours PRN        08/21/22 1449              Gwyneth Sprout, MD 08/21/22 1519

## 2022-08-21 NOTE — Discharge Instructions (Addendum)
Your urine had blood in it today and suspect that you most likely passed a kidney stone.  There is no signs of stones at this time and no signs of diverticulitis.  The rest of your blood work looked okay.  On your scan today they did see some thickening in your esophagus and a few lymph nodes that they did recommend you would need follow-up with an endoscopy.  Call your doctor so they can get this set up for you.  You were given a prescription for some pain medication that you can take as needed however if Tylenol at home takes care of it you do not have to take the stronger medication.

## 2022-08-21 NOTE — ED Provider Triage Note (Signed)
Emergency Medicine Provider Triage Evaluation Note  Debbe Crumble , a 42 y.o. female  was evaluated in triage.  Pt complains of llq pain. Began this AM. Constipation, difficulty urination. No hematuria. No hx of stones. Hx of diverticulitis. Feels similar. No prior colonoscopy  Review of Systems  Positive: LLQ pain Negative: Fever, hematuria  Physical Exam  BP (!) 163/98 (BP Location: Right Arm)   Pulse 99   Temp 97.8 F (36.6 C) (Oral)   Resp 18   Ht  (1.6 m)   Wt 89.8 kg   SpO2 100%   BMI 35.07 kg/m  Gen:   Awake, no distress   Resp:  Normal effort  MSK:   Moves extremities without difficulty  Other:    Medical Decision Making  Medically screening exam initiated at 10:39 AM.  Appropriate orders placed.  Ariday Trew was informed that the remainder of the evaluation will be completed by another provider, this initial triage assessment does not replace that evaluation, and the importance of remaining in the ED until their evaluation is complete.  Llq pain   Kealie Barrie A, PA-C 08/21/22 1040

## 2022-10-28 DIAGNOSIS — E7800 Pure hypercholesterolemia, unspecified: Secondary | ICD-10-CM | POA: Diagnosis not present

## 2022-10-28 DIAGNOSIS — Z8669 Personal history of other diseases of the nervous system and sense organs: Secondary | ICD-10-CM | POA: Diagnosis not present

## 2022-10-28 DIAGNOSIS — M2550 Pain in unspecified joint: Secondary | ICD-10-CM | POA: Diagnosis not present

## 2022-10-28 DIAGNOSIS — E1169 Type 2 diabetes mellitus with other specified complication: Secondary | ICD-10-CM | POA: Diagnosis not present

## 2022-10-31 DIAGNOSIS — G629 Polyneuropathy, unspecified: Secondary | ICD-10-CM | POA: Diagnosis not present

## 2022-10-31 DIAGNOSIS — K2289 Other specified disease of esophagus: Secondary | ICD-10-CM | POA: Diagnosis not present

## 2022-10-31 DIAGNOSIS — J4520 Mild intermittent asthma, uncomplicated: Secondary | ICD-10-CM | POA: Diagnosis not present

## 2022-10-31 DIAGNOSIS — E1140 Type 2 diabetes mellitus with diabetic neuropathy, unspecified: Secondary | ICD-10-CM | POA: Diagnosis not present

## 2022-11-28 DIAGNOSIS — E1165 Type 2 diabetes mellitus with hyperglycemia: Secondary | ICD-10-CM | POA: Diagnosis not present

## 2022-11-28 DIAGNOSIS — E1140 Type 2 diabetes mellitus with diabetic neuropathy, unspecified: Secondary | ICD-10-CM | POA: Diagnosis not present

## 2022-11-28 DIAGNOSIS — E785 Hyperlipidemia, unspecified: Secondary | ICD-10-CM | POA: Diagnosis not present

## 2022-12-02 DIAGNOSIS — R509 Fever, unspecified: Secondary | ICD-10-CM | POA: Diagnosis not present

## 2022-12-02 DIAGNOSIS — R059 Cough, unspecified: Secondary | ICD-10-CM | POA: Diagnosis not present

## 2022-12-02 DIAGNOSIS — R0989 Other specified symptoms and signs involving the circulatory and respiratory systems: Secondary | ICD-10-CM | POA: Diagnosis not present

## 2022-12-02 DIAGNOSIS — R0602 Shortness of breath: Secondary | ICD-10-CM | POA: Diagnosis not present

## 2022-12-18 DIAGNOSIS — E785 Hyperlipidemia, unspecified: Secondary | ICD-10-CM | POA: Diagnosis not present

## 2022-12-18 DIAGNOSIS — R748 Abnormal levels of other serum enzymes: Secondary | ICD-10-CM | POA: Diagnosis not present

## 2022-12-23 DIAGNOSIS — F4320 Adjustment disorder, unspecified: Secondary | ICD-10-CM | POA: Diagnosis not present

## 2022-12-23 DIAGNOSIS — F988 Other specified behavioral and emotional disorders with onset usually occurring in childhood and adolescence: Secondary | ICD-10-CM | POA: Diagnosis not present

## 2022-12-23 DIAGNOSIS — F4321 Adjustment disorder with depressed mood: Secondary | ICD-10-CM | POA: Diagnosis not present

## 2022-12-23 DIAGNOSIS — E1165 Type 2 diabetes mellitus with hyperglycemia: Secondary | ICD-10-CM | POA: Diagnosis not present

## 2022-12-25 DIAGNOSIS — R7989 Other specified abnormal findings of blood chemistry: Secondary | ICD-10-CM | POA: Diagnosis not present

## 2022-12-25 DIAGNOSIS — Z638 Other specified problems related to primary support group: Secondary | ICD-10-CM | POA: Diagnosis not present

## 2022-12-25 DIAGNOSIS — R200 Anesthesia of skin: Secondary | ICD-10-CM | POA: Diagnosis not present

## 2022-12-25 DIAGNOSIS — F4321 Adjustment disorder with depressed mood: Secondary | ICD-10-CM | POA: Diagnosis not present

## 2022-12-25 DIAGNOSIS — R945 Abnormal results of liver function studies: Secondary | ICD-10-CM | POA: Diagnosis not present

## 2022-12-25 DIAGNOSIS — R161 Splenomegaly, not elsewhere classified: Secondary | ICD-10-CM | POA: Diagnosis not present

## 2022-12-25 DIAGNOSIS — R053 Chronic cough: Secondary | ICD-10-CM | POA: Diagnosis not present

## 2022-12-30 DIAGNOSIS — R161 Splenomegaly, not elsewhere classified: Secondary | ICD-10-CM | POA: Diagnosis not present

## 2023-01-29 DIAGNOSIS — R161 Splenomegaly, not elsewhere classified: Secondary | ICD-10-CM | POA: Diagnosis not present

## 2023-01-29 DIAGNOSIS — R109 Unspecified abdominal pain: Secondary | ICD-10-CM | POA: Diagnosis not present

## 2023-01-30 ENCOUNTER — Other Ambulatory Visit: Payer: Self-pay

## 2023-01-30 DIAGNOSIS — K869 Disease of pancreas, unspecified: Secondary | ICD-10-CM

## 2023-01-30 DIAGNOSIS — F339 Major depressive disorder, recurrent, unspecified: Secondary | ICD-10-CM | POA: Diagnosis not present

## 2023-01-30 DIAGNOSIS — R748 Abnormal levels of other serum enzymes: Secondary | ICD-10-CM

## 2023-02-05 ENCOUNTER — Ambulatory Visit
Admission: RE | Admit: 2023-02-05 | Discharge: 2023-02-05 | Disposition: A | Discharge status: Home / Self Care | Payer: BC Managed Care – PPO | Source: Ambulatory Visit

## 2023-02-05 DIAGNOSIS — K760 Fatty (change of) liver, not elsewhere classified: Secondary | ICD-10-CM | POA: Diagnosis not present

## 2023-02-05 DIAGNOSIS — R161 Splenomegaly, not elsewhere classified: Secondary | ICD-10-CM | POA: Diagnosis not present

## 2023-02-05 DIAGNOSIS — R748 Abnormal levels of other serum enzymes: Secondary | ICD-10-CM

## 2023-02-05 DIAGNOSIS — K869 Disease of pancreas, unspecified: Secondary | ICD-10-CM

## 2023-02-05 DIAGNOSIS — R590 Localized enlarged lymph nodes: Secondary | ICD-10-CM | POA: Diagnosis not present

## 2023-02-05 MED ORDER — IOPAMIDOL (ISOVUE-370) INJECTION 76%
75.0000 mL | Freq: Once | INTRAVENOUS | Status: AC | PRN
  Administered 2023-02-05: 75 mL via INTRAVENOUS

## 2023-02-06 DIAGNOSIS — F339 Major depressive disorder, recurrent, unspecified: Secondary | ICD-10-CM | POA: Diagnosis not present

## 2023-02-11 DIAGNOSIS — F411 Generalized anxiety disorder: Secondary | ICD-10-CM | POA: Diagnosis not present

## 2023-02-11 DIAGNOSIS — F339 Major depressive disorder, recurrent, unspecified: Secondary | ICD-10-CM | POA: Diagnosis not present

## 2023-02-13 DIAGNOSIS — F339 Major depressive disorder, recurrent, unspecified: Secondary | ICD-10-CM | POA: Diagnosis not present

## 2023-02-13 DIAGNOSIS — F4321 Adjustment disorder with depressed mood: Secondary | ICD-10-CM | POA: Diagnosis not present

## 2023-02-20 DIAGNOSIS — F339 Major depressive disorder, recurrent, unspecified: Secondary | ICD-10-CM | POA: Diagnosis not present

## 2023-02-20 DIAGNOSIS — F4321 Adjustment disorder with depressed mood: Secondary | ICD-10-CM | POA: Diagnosis not present

## 2023-02-20 DIAGNOSIS — F411 Generalized anxiety disorder: Secondary | ICD-10-CM | POA: Diagnosis not present

## 2023-02-24 ENCOUNTER — Telehealth: Payer: Self-pay | Admitting: Genetic Counselor

## 2023-02-24 NOTE — Progress Notes (Signed)
Received request for appointment with Hematology for evaluation.

## 2023-02-27 DIAGNOSIS — F4321 Adjustment disorder with depressed mood: Secondary | ICD-10-CM | POA: Diagnosis not present

## 2023-02-27 DIAGNOSIS — F339 Major depressive disorder, recurrent, unspecified: Secondary | ICD-10-CM | POA: Diagnosis not present

## 2023-02-27 DIAGNOSIS — F411 Generalized anxiety disorder: Secondary | ICD-10-CM | POA: Diagnosis not present

## 2023-03-12 ENCOUNTER — Inpatient Hospital Stay: Payer: BC Managed Care – PPO

## 2023-03-12 ENCOUNTER — Inpatient Hospital Stay: Payer: BC Managed Care – PPO | Attending: Hematology and Oncology | Admitting: Hematology and Oncology

## 2023-03-12 VITALS — BP 126/89 | HR 96 | Temp 97.8°F | Resp 17 | Wt 174.9 lb

## 2023-03-12 DIAGNOSIS — R5383 Other fatigue: Secondary | ICD-10-CM | POA: Diagnosis not present

## 2023-03-12 DIAGNOSIS — E119 Type 2 diabetes mellitus without complications: Secondary | ICD-10-CM | POA: Diagnosis not present

## 2023-03-12 DIAGNOSIS — Z720 Tobacco use: Secondary | ICD-10-CM | POA: Insufficient documentation

## 2023-03-12 DIAGNOSIS — K760 Fatty (change of) liver, not elsewhere classified: Secondary | ICD-10-CM | POA: Insufficient documentation

## 2023-03-12 DIAGNOSIS — Z800 Family history of malignant neoplasm of digestive organs: Secondary | ICD-10-CM | POA: Diagnosis not present

## 2023-03-12 DIAGNOSIS — M069 Rheumatoid arthritis, unspecified: Secondary | ICD-10-CM | POA: Diagnosis not present

## 2023-03-12 DIAGNOSIS — R161 Splenomegaly, not elsewhere classified: Secondary | ICD-10-CM

## 2023-03-12 DIAGNOSIS — I10 Essential (primary) hypertension: Secondary | ICD-10-CM | POA: Insufficient documentation

## 2023-03-12 DIAGNOSIS — Z7984 Long term (current) use of oral hypoglycemic drugs: Secondary | ICD-10-CM | POA: Insufficient documentation

## 2023-03-12 DIAGNOSIS — R591 Generalized enlarged lymph nodes: Secondary | ICD-10-CM | POA: Diagnosis not present

## 2023-03-12 LAB — CBC WITH DIFFERENTIAL (CANCER CENTER ONLY)
Abs Immature Granulocytes: 0.04 10*3/uL (ref 0.00–0.07)
Basophils Absolute: 0.1 10*3/uL (ref 0.0–0.1)
Basophils Relative: 1 %
Eosinophils Absolute: 0.6 10*3/uL — ABNORMAL HIGH (ref 0.0–0.5)
Eosinophils Relative: 7 %
HCT: 44 % (ref 36.0–46.0)
Hemoglobin: 14.4 g/dL (ref 12.0–15.0)
Immature Granulocytes: 1 %
Lymphocytes Relative: 24 %
Lymphs Abs: 2 10*3/uL (ref 0.7–4.0)
MCH: 26.3 pg (ref 26.0–34.0)
MCHC: 32.7 g/dL (ref 30.0–36.0)
MCV: 80.4 fL (ref 80.0–100.0)
Monocytes Absolute: 0.4 10*3/uL (ref 0.1–1.0)
Monocytes Relative: 5 %
Neutro Abs: 5.1 10*3/uL (ref 1.7–7.7)
Neutrophils Relative %: 62 %
Platelet Count: 338 10*3/uL (ref 150–400)
RBC: 5.47 MIL/uL — ABNORMAL HIGH (ref 3.87–5.11)
RDW: 16.6 % — ABNORMAL HIGH (ref 11.5–15.5)
WBC Count: 8.2 10*3/uL (ref 4.0–10.5)
nRBC: 0 % (ref 0.0–0.2)

## 2023-03-12 LAB — LACTATE DEHYDROGENASE: LDH: 174 U/L (ref 98–192)

## 2023-03-12 LAB — CMP (CANCER CENTER ONLY)
ALT: 21 U/L (ref 0–44)
AST: 27 U/L (ref 15–41)
Albumin: 4.5 g/dL (ref 3.5–5.0)
Alkaline Phosphatase: 192 U/L — ABNORMAL HIGH (ref 38–126)
Anion gap: 9 (ref 5–15)
BUN: 11 mg/dL (ref 6–20)
CO2: 27 mmol/L (ref 22–32)
Calcium: 10.3 mg/dL (ref 8.9–10.3)
Chloride: 100 mmol/L (ref 98–111)
Creatinine: 0.66 mg/dL (ref 0.44–1.00)
GFR, Estimated: >60 mL/min (ref >60)
Glucose, Bld: 97 mg/dL (ref 70–99)
Potassium: 3.9 mmol/L (ref 3.5–5.1)
Sodium: 136 mmol/L (ref 135–145)
Total Bilirubin: 0.9 mg/dL (ref <1.2)
Total Protein: 9.7 g/dL — ABNORMAL HIGH (ref 6.5–8.1)

## 2023-03-12 LAB — MONONUCLEOSIS SCREEN: Mono Screen: NEGATIVE

## 2023-03-12 LAB — C-REACTIVE PROTEIN: CRP: 1.3 mg/dL — ABNORMAL HIGH (ref <1.0)

## 2023-03-12 LAB — SEDIMENTATION RATE: Sed Rate: 38 mm/h — ABNORMAL HIGH (ref 0–22)

## 2023-03-12 NOTE — Progress Notes (Signed)
[facility] [phone]   [phone]  INITIAL CONSULT NOTE  Patient Care Team: Linus Galas, NP as PCP - General  Hematological/Oncological History # Splenomegaly/Lymphadenopathy 03/12/2023: establish care with Dr. Leonides Schanz   CHIEF COMPLAINTS/PURPOSE OF CONSULTATION:  "Splenomegaly/Lymphadenopathy "  HISTORY OF PRESENTING ILLNESS:  Linda Gray 42 y.o. female with medical history significant for type 2 diabetes, hyperlipidemia, hypertension, and rheumatoid arthritis who presents for evaluation of splenomegaly.  On review of the previous records Linda Gray underwent a CT scan of the abdomen on 02/05/2023 which showed splenomegaly with mild upper abdominal lymphadenopathy as well as mild hepatic steatosis.  Due to concern for this finding the patient was referred to hematology for further evaluation and management.  On exam today Linda Gray reports that last year she began developing inflammation in her toes and her feet.  She has been evaluated by rheumatology but decided to go the natural route.  In May and June she began developing increasing stomach pain.  She reports that she is concerned because her mother has a history of liver cancer.  Approximately 6 weeks ago she underwent ultrasound which showed a increase in the size of her spleen as well as fatty liver and increased lymph nodes.  She also had CT scan of her abdomen performed in April as well as October.  She reports that she has a lot of pain on her left side as well as fatigue.  She reports that she wakes up tired.  She notes that she is not having any runny nose, sore throat, or cough.  She does have periodic issues with headaches.  She notes that she was using an eczema cream and her rash is currently under control.  On further discussion she reports that her mother was diagnosed with liver cancer in August 2024.  She reports her father died last year of a heart attack.  Her brother has  rheumatoid arthritis and has "sausage fingers".  She reports that she has a healthy sister and no children.  She used to smoke hookah but does no longer.  She notes that she drinks alcohol occasionally.  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  She otherwise denies any fevers, chills, sweats, nausea Vomiting or diarrhea.  Full 10 point ROS is otherwise negative.  MEDICAL HISTORY:  Past Medical History:  Diagnosis Date   ADD (attention deficit disorder)    Anxiety    Diabetes mellitus (HCC)    Headache    Hyperlipidemia    Hypertension     SURGICAL HISTORY: No past surgical history on file.  SOCIAL HISTORY: Social History   Socioeconomic History   Marital status: Single    Spouse name: Not on file   Number of children: Not on file   Years of education: Not on file   Highest education level: Not on file  Occupational History   Not on file  Tobacco Use   Smoking status: Some Days   Smokeless tobacco: Never  Substance and Sexual Activity   Alcohol use: Yes    Comment: occ   Drug use: No   Sexual activity: Yes    Birth control/protection: I.U.D.  Other Topics Concern   Not on file  Social History Narrative   Not on file   Social Determinants of Health   Financial Resource Strain: Not on file  Food Insecurity: No Food Insecurity (04/18/2022)   Received from [facility], [facility]   Hunger Vital Sign  Worried About Programme researcher, broadcasting/film/video in the Last Year: Never true    Ran Out of Food in the Last Year: Never true  Transportation Needs: Not on file  Physical Activity: Not on file  Stress: Not on file  Social Connections: Unknown (02/27/2022)   Received from [facility], [facility]   Social Network    Social Network: Not on file  Intimate Partner Violence: Unknown (02/27/2022)   Received from [facility], [facility]   HITS    Physically Hurt: Not on file    Insult or Talk Down To: Not on file    Threaten Physical Harm: Not on file     Scream or Curse: Not on file    FAMILY HISTORY: Family History  Problem Relation Age of Onset   Hypertension Father    Diabetes Father     ALLERGIES:  is allergic to clindamycin, latex, naltrexone-bupropion hcl er, shellfish allergy, and sumatriptan.  MEDICATIONS:  Current Outpatient Medications  Medication Sig Dispense Refill   amitriptyline (ELAVIL) 25 MG tablet Take 1 tablet by mouth at bedtime.     MOUNJARO 7.5 MG/0.5ML Pen Inject 7.5 mg into the skin once a week.     TURMERIC CURCUMIN PO Take by mouth.     albuterol (VENTOLIN HFA) 108 (90 Base) MCG/ACT inhaler albuterol sulfate HFA 90 mcg/actuation aerosol inhaler (Patient not taking: Reported on 03/12/2023)     empagliflozin (JARDIANCE) 10 MG TABS tablet Take 1 tablet by mouth daily. (Patient not taking: Reported on 03/12/2023)     gabapentin (NEURONTIN) 300 MG capsule TAKE ONE CAPSULE BY MOUTH EVERY MORNING AND 2 CAPSULES EVERY EVENING     HYDROcodone-acetaminophen (NORCO/VICODIN) 5-325 MG tablet Take 1 tablet by mouth every 6 (six) hours as needed for severe pain. (Patient not taking: Reported on 03/12/2023) 6 tablet 0   levonorgestrel (MIRENA, 52 MG,) 20 MCG/DAY IUD Mirena 20 mcg/24 hours (6 yrs) 52 mg intrauterine device  Take 1 device by intrauterine route.     methotrexate (RHEUMATREX) 2.5 MG tablet Take by mouth. (Patient not taking: Reported on 03/12/2023)     sulfaSALAzine (AZULFIDINE) 500 MG tablet Take by mouth. (Patient not taking: Reported on 03/12/2023)     Ubrogepant (UBRELVY) 100 MG TABS      Current Facility-Administered Medications  Medication Dose Route Frequency Provider Last Rate Last Admin   triamcinolone acetonide (KENALOG) 10 MG/ML injection 20 mg  20 mg Other Once Lenn Sink, DPM        REVIEW OF SYSTEMS:   Constitutional: ( - ) fevers, ( - )  chills , ( - ) night sweats Eyes: ( - ) blurriness of vision, ( - ) double vision, ( - ) watery eyes Ears, nose, mouth, throat, and face: ( - ) mucositis, ( -  ) sore throat Respiratory: ( - ) cough, ( - ) dyspnea, ( - ) wheezes Cardiovascular: ( - ) palpitation, ( - ) chest discomfort, ( - ) lower extremity swelling Gastrointestinal:  ( - ) nausea, ( - ) heartburn, ( - ) change in bowel habits Skin: ( - ) abnormal skin rashes Lymphatics: ( - ) new lymphadenopathy, ( - ) easy bruising Neurological: ( - ) numbness, ( - ) tingling, ( - ) new weaknesses Behavioral/Psych: ( - ) mood change, ( - ) new changes  All other systems were reviewed with the patient and are negative.  PHYSICAL EXAMINATION:  Vitals:   03/12/23 1303  BP: 126/89  Pulse: 96  Resp: 17  Temp: 97.8 F (36.6 C)  SpO2: 99%   Filed Weights   03/12/23 1303  Weight: 174 lb 14.4 oz (79.3 kg)    GENERAL: well appearing middle-aged African-American female in NAD  SKIN: skin color, texture, turgor are normal, no rashes or significant lesions EYES: conjunctiva are pink and non-injected, sclera clear LUNGS: clear to auscultation and percussion with normal breathing effort HEART: regular rate & rhythm and no murmurs and no lower extremity edema Musculoskeletal: no cyanosis of digits and no clubbing  PSYCH: alert & oriented x 3, fluent speech NEURO: no focal motor/sensory deficits  LABORATORY DATA:  I have reviewed the data as listed    Latest Ref Rng & Units 03/12/2023    2:28 PM 08/21/2022   10:58 AM 10/29/2010    2:46 PM  CBC  WBC 4.0 - 10.5 K/uL 8.2  8.1  13.4   Hemoglobin 12.0 - 15.0 g/dL 16.1  09.6  04.5   Hematocrit 36.0 - 46.0 % 44.0  39.3  36.4   Platelets 150 - 400 K/uL 338  297  403        Latest Ref Rng & Units 03/12/2023    2:28 PM 08/21/2022   10:58 AM 10/29/2010    2:46 PM  CMP  Glucose 70 - 99 mg/dL 97  409  86   BUN 6 - 20 mg/dL 11  9  11    Creatinine 0.44 - 1.00 mg/dL 8.11  9.14  7.82   Sodium 135 - 145 mmol/L 136  135  136   Potassium 3.5 - 5.1 mmol/L 3.9  4.0  4.1   Chloride 98 - 111 mmol/L 100  100  99   CO2 22 - 32 mmol/L 27  26  27    Calcium  8.9 - 10.3 mg/dL 95.6  9.0  9.5   Total Protein 6.5 - 8.1 g/dL 9.7  8.3  8.4   Total Bilirubin <1.2 mg/dL 0.9  0.5  0.4   Alkaline Phos 38 - 126 U/L 192  73  105   AST 15 - 41 U/L 27  28  22    ALT 0 - 44 U/L 21  25  15       ASSESSMENT & PLAN Linda Gray 42 y.o. female with medical history significant for type 2 diabetes, hyperlipidemia, hypertension, and rheumatoid arthritis who presents for evaluation of splenomegaly.  After review of the labs, review of the records, and discussion with the patient the patients findings are most consistent with splenomegaly and lymphadenopathy secondary to underlying inflammatory disorder.  #Splenomegaly/Lymphadenopathy -- Will order CBC, CMP, LDH and ESR/CRP -- Additionally will order serologies for hepatitis B, hepatitis C, and HIV -- Will order flow cytometry to assess for abnormal lymphocytes -- Will order EBV studies as well as a mononucleosis screen -- At this time findings are most consistent with splenomegaly and lymphadenopathy due to underlying inflammatory disorder.  Approximately 10 to 15% of patients with rheumatoid arthritis have splenomegaly. -- If there are concerning abnormalities with her above blood work would recommend we have the patient return to clinic for further evaluation and management.  Orders Placed This Encounter  Procedures   CBC with Differential (Cancer Center Only)    Standing Status:   Future    Number of Occurrences:   1    Standing Expiration Date:   03/11/2024   CMP (Cancer Center only)    Standing Status:   Future    Number of Occurrences:   1  Standing Expiration Date:   03/11/2024   Sedimentation rate    Standing Status:   Future    Number of Occurrences:   1    Standing Expiration Date:   03/11/2024   C-reactive protein    Standing Status:   Future    Number of Occurrences:   1    Standing Expiration Date:   03/11/2024   Epstein-Barr virus VCA, IgG    Standing Status:   Future    Number of  Occurrences:   1    Standing Expiration Date:   03/11/2024   Malachi Carl virus (EBV DNA by PCR)    Standing Status:   Future    Number of Occurrences:   1    Standing Expiration Date:   03/11/2024   Epstein-Barr virus nuclear antigen antibody, IgG    Standing Status:   Future    Number of Occurrences:   1    Standing Expiration Date:   03/11/2024   Mononucleosis screen    Standing Status:   Future    Number of Occurrences:   1    Standing Expiration Date:   03/11/2024   Flow Cytometry, Peripheral Blood (Oncology)    Standing Status:   Future    Number of Occurrences:   1    Standing Expiration Date:   03/11/2024   Lactate dehydrogenase (LDH)    Standing Status:   Future    Number of Occurrences:   1    Standing Expiration Date:   03/11/2024    All questions were answered. The patient knows to call the clinic with any problems, questions or concerns.  A total of more than 60 minutes were spent on this encounter with face-to-face time and non-face-to-face time, including preparing to see the patient, ordering tests and/or medications, counseling the patient and coordination of care as outlined above.   Ulysees Barns, MD Department of Hematology/Oncology [facility] Phone: [phone] Pager: [phone] Email: [email or web address]  03/23/2023 7:16 PM

## 2023-03-13 LAB — EPSTEIN-BARR VIRUS NUCLEAR ANTIGEN ANTIBODY, IGG: EBV NA IgG: >600 U/mL — ABNORMAL HIGH (ref 0.0–17.9)

## 2023-03-13 LAB — EPSTEIN BARR VRS(EBV DNA BY PCR): EBV DNA QN by PCR: NEGATIVE [IU]/mL

## 2023-03-13 LAB — SURGICAL PATHOLOGY

## 2023-03-13 LAB — EPSTEIN-BARR VIRUS VCA, IGG: EBV VCA IgG: >600 U/mL — ABNORMAL HIGH (ref 0.0–17.9)

## 2023-03-14 LAB — FLOW CYTOMETRY

## 2023-03-18 NOTE — Progress Notes (Signed)
Patient was seen by Dr Leonides Schanz on 03/12/23, no need for further testing and follow up as needed.

## 2023-03-20 ENCOUNTER — Telehealth: Payer: Self-pay | Admitting: *Deleted

## 2023-03-20 NOTE — Telephone Encounter (Signed)
Received call from patient regarding recent lab results. After discussing results with Dr. 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 Also her inflammatory markers are elevated likely due to her RA. The mild splenomegaly is likely due to her underlying inflammatory illness. Advised to f/u with her Rheumatologist. Pt voiced understanding.

## 2023-03-25 DIAGNOSIS — E1165 Type 2 diabetes mellitus with hyperglycemia: Secondary | ICD-10-CM | POA: Diagnosis not present

## 2023-04-02 DIAGNOSIS — E785 Hyperlipidemia, unspecified: Secondary | ICD-10-CM | POA: Diagnosis not present

## 2023-04-02 DIAGNOSIS — G629 Polyneuropathy, unspecified: Secondary | ICD-10-CM | POA: Diagnosis not present

## 2023-04-02 DIAGNOSIS — F988 Other specified behavioral and emotional disorders with onset usually occurring in childhood and adolescence: Secondary | ICD-10-CM | POA: Diagnosis not present

## 2023-04-02 DIAGNOSIS — E1165 Type 2 diabetes mellitus with hyperglycemia: Secondary | ICD-10-CM | POA: Diagnosis not present

## 2023-05-20 DIAGNOSIS — E1165 Type 2 diabetes mellitus with hyperglycemia: Secondary | ICD-10-CM | POA: Diagnosis not present

## 2023-05-20 DIAGNOSIS — G629 Polyneuropathy, unspecified: Secondary | ICD-10-CM | POA: Diagnosis not present

## 2023-05-20 DIAGNOSIS — E785 Hyperlipidemia, unspecified: Secondary | ICD-10-CM | POA: Diagnosis not present

## 2024-03-19 ENCOUNTER — Other Ambulatory Visit (HOSPITAL_BASED_OUTPATIENT_CLINIC_OR_DEPARTMENT_OTHER): Payer: Self-pay

## 2024-03-19 DIAGNOSIS — E785 Hyperlipidemia, unspecified: Secondary | ICD-10-CM

## 2024-04-15 ENCOUNTER — Ambulatory Visit
Admission: RE | Admit: 2024-04-15 | Discharge: 2024-04-15 | Disposition: A | Discharge status: Home / Self Care | Payer: Self-pay | Source: Ambulatory Visit

## 2024-04-15 DIAGNOSIS — E785 Hyperlipidemia, unspecified: Secondary | ICD-10-CM | POA: Insufficient documentation
